# Patient Record
Sex: Male | Born: 1955 | Race: White | Hispanic: No | Marital: Married | Smoking: Former smoker
Health system: Southern US, Community
[De-identification: ages and names within clinical notes are randomized; demographics above are authoritative.]

## PROBLEM LIST (undated history)

## (undated) DIAGNOSIS — H269 Unspecified cataract: Secondary | ICD-10-CM

## (undated) DIAGNOSIS — I10 Essential (primary) hypertension: Secondary | ICD-10-CM

## (undated) HISTORY — PX: COLONOSCOPY: SHX174

## (undated) HISTORY — DX: Unspecified cataract: H26.9

## (undated) HISTORY — DX: Essential (primary) hypertension: I10

## (undated) HISTORY — PX: COLOSTOMY: SHX63

## (undated) HISTORY — PX: HERNIA REPAIR: SHX51

## (undated) HISTORY — PX: OPEN REDUCTION INTERNAL FIXATION (ORIF) TIBIA/FIBULA FRACTURE: SHX5992

---

## 1998-01-14 HISTORY — PX: ANAL FISSURE REPAIR: SHX2312

## 1998-11-30 ENCOUNTER — Ambulatory Visit: Admission: RE | Admit: 1998-11-30 | Discharge: 1998-11-30 | Payer: Self-pay | Admitting: Otolaryngology

## 1999-01-12 ENCOUNTER — Encounter (INDEPENDENT_AMBULATORY_CARE_PROVIDER_SITE_OTHER): Payer: Self-pay | Admitting: Specialist

## 1999-01-12 ENCOUNTER — Other Ambulatory Visit: Admission: RE | Admit: 1999-01-12 | Discharge: 1999-01-12 | Payer: Self-pay | Admitting: Otolaryngology

## 2003-05-26 ENCOUNTER — Encounter: Admission: RE | Admit: 2003-05-26 | Discharge: 2003-05-26 | Payer: Self-pay | Admitting: Family Medicine

## 2010-01-28 ENCOUNTER — Emergency Department (HOSPITAL_COMMUNITY)
Admission: EM | Admit: 2010-01-28 | Discharge: 2010-01-28 | Payer: Self-pay | Source: Home / Self Care | Admitting: Emergency Medicine

## 2010-01-29 LAB — POCT CARDIAC MARKERS
CKMB, poc: <1 ng/mL — ABNORMAL LOW (ref 1.0–8.0)
Myoglobin, poc: 64.6 ng/mL (ref 12–200)
Troponin i, poc: 0.06 ng/mL (ref 0.00–0.09)

## 2010-01-29 LAB — CBC
HCT: 44.2 % (ref 39.0–52.0)
Hemoglobin: 15.2 g/dL (ref 13.0–17.0)
MCH: 30.1 pg (ref 26.0–34.0)
MCHC: 34.4 g/dL (ref 30.0–36.0)
MCV: 87.5 fL (ref 78.0–100.0)
Platelets: 216 10*3/uL (ref 150–400)
RBC: 5.05 MIL/uL (ref 4.22–5.81)
RDW: 12.3 % (ref 11.5–15.5)
WBC: 9 10*3/uL (ref 4.0–10.5)

## 2010-01-29 LAB — BASIC METABOLIC PANEL
BUN: 9 mg/dL (ref 6–23)
CO2: 29 mEq/L (ref 19–32)
Calcium: 9.4 mg/dL (ref 8.4–10.5)
Chloride: 103 mEq/L (ref 96–112)
Creatinine, Ser: 0.89 mg/dL (ref 0.4–1.5)
GFR calc Af Amer: >60 mL/min (ref >60)
GFR calc non Af Amer: >60 mL/min (ref >60)
Glucose, Bld: 103 mg/dL — ABNORMAL HIGH (ref 70–99)
Potassium: 4.1 mEq/L (ref 3.5–5.1)
Sodium: 143 mEq/L (ref 135–145)

## 2011-03-05 ENCOUNTER — Telehealth (INDEPENDENT_AMBULATORY_CARE_PROVIDER_SITE_OTHER): Payer: Self-pay

## 2011-03-05 NOTE — Telephone Encounter (Signed)
Spoke to patient with his appointment date & time (03/21/11 w/Dr. Johna Sheriff)

## 2011-03-05 NOTE — Telephone Encounter (Signed)
The patient has had double hernia repair before by Dr Johna Sheriff.  He has an umbilical hernia that caused severe pain yesterday.  He felt flushed and took some gas-x to see if it helped.  He was not vomiting.  He has had a small bowel movement.  I instructed him on what to watch for that would require going to the ER.  He would like a soon appointment with Dr Johna Sheriff.  His pain today is only a level 1.  Please call if can work him in.

## 2011-03-15 ENCOUNTER — Telehealth (INDEPENDENT_AMBULATORY_CARE_PROVIDER_SITE_OTHER): Payer: Self-pay

## 2011-03-15 NOTE — Telephone Encounter (Signed)
Left message to call our office to schedule follow up appointment w/Dr. Johna Sheriff

## 2011-03-21 ENCOUNTER — Ambulatory Visit (INDEPENDENT_AMBULATORY_CARE_PROVIDER_SITE_OTHER): Payer: BC Managed Care – PPO | Admitting: General Surgery

## 2011-03-21 ENCOUNTER — Telehealth (INDEPENDENT_AMBULATORY_CARE_PROVIDER_SITE_OTHER): Payer: Self-pay | Admitting: General Surgery

## 2011-03-21 ENCOUNTER — Encounter (INDEPENDENT_AMBULATORY_CARE_PROVIDER_SITE_OTHER): Payer: Self-pay | Admitting: General Surgery

## 2011-03-21 VITALS — BP 132/80 | HR 76 | Temp 97.6°F | Resp 16 | Ht 69.0 in | Wt 229.6 lb

## 2011-03-21 DIAGNOSIS — K429 Umbilical hernia without obstruction or gangrene: Secondary | ICD-10-CM

## 2011-03-21 NOTE — Progress Notes (Signed)
Subjective:   Umbilical hernia  Patient ID: Bryan Hampton, male   DOB: 09/09/1955, 56 y.o.   MRN: [id number]  HPI Patient returns to the office known to me from a previous bilateral open inguinal hernia repair in 2005. He has done well from that but within a couple of years afterwards he noted a small bulge at his umbilicus. This has been entirely asymptomatic until about 3 weeks ago. At that time he suddenly developed some diffuse abdominal pain as well as diaphoresis and weakness with a sharp pain and tenderness at his umbilicus. This resolved after rest after about 45 minutes. It has not recurred. He is here to discuss repair.  Past Medical History  Diagnosis Date  . Hypertension    Past Surgical History  Procedure Date  . Hernia repair 2006, 2007  . Anal fissure repair 2000   Current Outpatient Prescriptions  Medication Sig Dispense Refill  . MICARDIS 80 MG tablet        Allergies  Allergen Reactions  . Penicillins Hives and Itching   History  Substance Use Topics  . Smoking status: Former Smoker    Types: Cigarettes    Quit date: 03/20/1988  . Smokeless tobacco: Not on file  . Alcohol Use: Yes    Review of Systems  Constitutional: Negative.   Respiratory: Negative.   Cardiovascular: Negative.   Gastrointestinal: Positive for abdominal pain. Negative for nausea, vomiting, diarrhea, constipation, abdominal distention and anal bleeding.       Objective:   Physical Exam BP 132/80  Pulse 76  Temp(Src) 97.6 F (36.4 C) (Temporal)  Resp 16  Ht 5\' 9"  (1.753 m)  Wt 229 lb 9.6 oz (104.146 kg)  BMI 33.91 kg/m2 General: Alert, well-developed Caucasian, in no distress Skin: Warm and dry without rash or infection. HEENT: No palpable masses or thyromegaly. Sclera nonicteric. Pupils equal round and reactive. Oropharynx clear. Lymph nodes: No cervical, supraclavicular, or inguinal nodes palpable. Lungs: Breath sounds clear and equal without increased work of  breathing Cardiovascular: Regular rate and rhythm without murmur. No JVD or edema. Peripheral pulses intact. Abdomen: Nondistended. Soft and nontender. No masses palpable. No organomegaly. Moderately obese. There is a moderate diastases. There is a discrete small reducible approximately 1-2 cm hernia at the umbilicus. Extremities: No edema or joint swelling or deformity. No chronic venous stasis changes. Neurologic: Alert and fully oriented. Gait normal.     Assessment:     Symptomatic umbilical hernia. He has had a brief episode of incarceration by history and this needs to be repaired. I recommended repair with mesh under general anesthesia an outpatient. Risks of bleeding, infection, recurrence, and anesthetic risks were discussed and understood and he desires to proceed.    Plan:     Repair of umbilical hernia with mesh as an outpatient.

## 2011-03-27 DIAGNOSIS — K429 Umbilical hernia without obstruction or gangrene: Secondary | ICD-10-CM

## 2011-04-03 ENCOUNTER — Telehealth (INDEPENDENT_AMBULATORY_CARE_PROVIDER_SITE_OTHER): Payer: Self-pay

## 2011-04-03 NOTE — Telephone Encounter (Signed)
Po appt given to patient for 04/19/11 @ 9:10 w/Dr. Johna Sheriff

## 2011-04-19 ENCOUNTER — Ambulatory Visit (INDEPENDENT_AMBULATORY_CARE_PROVIDER_SITE_OTHER): Payer: BC Managed Care – PPO | Admitting: General Surgery

## 2011-04-19 ENCOUNTER — Encounter (INDEPENDENT_AMBULATORY_CARE_PROVIDER_SITE_OTHER): Payer: Self-pay | Admitting: General Surgery

## 2011-04-19 VITALS — BP 110/72 | HR 78 | Temp 97.0°F | Ht 69.0 in | Wt 228.2 lb

## 2011-04-19 DIAGNOSIS — Z09 Encounter for follow-up examination after completed treatment for conditions other than malignant neoplasm: Secondary | ICD-10-CM

## 2011-04-19 NOTE — Progress Notes (Signed)
The patient returns following umbilical hernia repair. He reports minimal postoperative discomfort that has resolved. No complaints today.  On exam the wound is well-healed and repair feels solid.  He is doing well following umbilical hernia repair and is discharged return as needed.

## 2013-03-25 LAB — HM COLONOSCOPY

## 2013-08-23 ENCOUNTER — Emergency Department (HOSPITAL_COMMUNITY)
Admission: EM | Admit: 2013-08-23 | Discharge: 2013-08-23 | Disposition: A | Discharge status: Home / Self Care | Payer: BC Managed Care – PPO | Attending: Emergency Medicine | Admitting: Emergency Medicine

## 2013-08-23 ENCOUNTER — Emergency Department (HOSPITAL_COMMUNITY): Payer: BC Managed Care – PPO

## 2013-08-23 ENCOUNTER — Encounter (HOSPITAL_COMMUNITY): Payer: Self-pay | Admitting: Emergency Medicine

## 2013-08-23 DIAGNOSIS — R0789 Other chest pain: Secondary | ICD-10-CM | POA: Insufficient documentation

## 2013-08-23 DIAGNOSIS — R079 Chest pain, unspecified: Secondary | ICD-10-CM | POA: Insufficient documentation

## 2013-08-23 DIAGNOSIS — Z880 Allergy status to penicillin: Secondary | ICD-10-CM | POA: Diagnosis not present

## 2013-08-23 DIAGNOSIS — Z79899 Other long term (current) drug therapy: Secondary | ICD-10-CM | POA: Diagnosis not present

## 2013-08-23 DIAGNOSIS — R209 Unspecified disturbances of skin sensation: Secondary | ICD-10-CM | POA: Diagnosis not present

## 2013-08-23 DIAGNOSIS — I10 Essential (primary) hypertension: Secondary | ICD-10-CM | POA: Insufficient documentation

## 2013-08-23 DIAGNOSIS — Z87891 Personal history of nicotine dependence: Secondary | ICD-10-CM | POA: Insufficient documentation

## 2013-08-23 DIAGNOSIS — Z792 Long term (current) use of antibiotics: Secondary | ICD-10-CM | POA: Insufficient documentation

## 2013-08-23 LAB — CBC
HCT: 47.3 % (ref 39.0–52.0)
Hemoglobin: 16.2 g/dL (ref 13.0–17.0)
MCH: 31 pg (ref 26.0–34.0)
MCHC: 34.2 g/dL (ref 30.0–36.0)
MCV: 90.4 fL (ref 78.0–100.0)
Platelets: 204 10*3/uL (ref 150–400)
RBC: 5.23 MIL/uL (ref 4.22–5.81)
RDW: 12.8 % (ref 11.5–15.5)
WBC: 7.9 10*3/uL (ref 4.0–10.5)

## 2013-08-23 LAB — BASIC METABOLIC PANEL
Anion gap: 12 (ref 5–15)
BUN: 13 mg/dL (ref 6–23)
CO2: 28 mEq/L (ref 19–32)
Calcium: 9.4 mg/dL (ref 8.4–10.5)
Chloride: 104 mEq/L (ref 96–112)
Creatinine, Ser: 0.8 mg/dL (ref 0.50–1.35)
GFR calc Af Amer: >90 mL/min (ref >90)
GFR calc non Af Amer: >90 mL/min (ref >90)
Glucose, Bld: 96 mg/dL (ref 70–99)
Potassium: 4.4 mEq/L (ref 3.7–5.3)
Sodium: 144 mEq/L (ref 137–147)

## 2013-08-23 LAB — I-STAT TROPONIN, ED
Troponin i, poc: 0.02 ng/mL (ref 0.00–0.08)
Troponin i, poc: 0 ng/mL (ref 0.00–0.08)

## 2013-08-23 NOTE — ED Notes (Signed)
Pt c/o left sided chest pressure and some numbness to left arm; pt sts some SOB that has now resolved

## 2013-08-23 NOTE — ED Notes (Signed)
Dr. Linker at bedside  

## 2013-08-23 NOTE — ED Provider Notes (Signed)
CSN: [id number]     Arrival date & time 08/23/13  1335 History   First MD Initiated Contact with Patient 08/23/13 1503     Chief Complaint  Patient presents with  . Chest Pain     (Consider location/radiation/quality/duration/timing/severity/associated sxs/prior Treatment) HPI Pt presents with c/o left hand and arm numbness associated with sensation of chest and abdominal pressure.  He states he was driving to a meeting and began to have some anxiety symptoms which he has had in the past.  With the anxiety he felt short of breath.  Symptoms were constant for approx 2 hours, now have resolved. No nausea, no diaphoresis.  No leg swelling. No recent travel/trauma/surgery.    Past Medical History  Diagnosis Date  . Hypertension    Past Surgical History  Procedure Laterality Date  . Hernia repair  2006, 2007  . Anal fissure repair  2000   History reviewed. No pertinent family history. History  Substance Use Topics  . Smoking status: Former Smoker    Types: Cigarettes    Quit date: 03/20/1988  . Smokeless tobacco: Not on file  . Alcohol Use: Yes    Review of Systems ROS reviewed and all otherwise negative except for mentioned in HPI    Allergies  Penicillins  Home Medications   Prior to Admission medications   Medication Sig Start Date End Date Taking? Authorizing Provider  doxycycline (VIBRA-TABS) 100 MG tablet Take 100 mg by mouth 2 (two) times daily.   Yes Historical Provider, MD  ibuprofen (ADVIL,MOTRIN) 200 MG tablet Take 400 mg by mouth every 6 (six) hours as needed (pain).   Yes Historical Provider, MD  losartan (COZAAR) 100 MG tablet Take 100 mg by mouth daily.   Yes Historical Provider, MD   BP 115/73  Pulse 74  Temp(Src) 97.9 F (36.6 C) (Oral)  Resp 18  SpO2 96% Vitals reviewed Physical Exam Physical Examination: General appearance - alert, well appearing, and in no distress Mental status - alert, oriented to person, place, and time Eyes - no  conjunctival injection, no scleral icterus Mouth - mucous membranes moist, pharynx normal without lesions Chest - clear to auscultation, no wheezes, rales or rhonchi, symmetric air entry Heart - normal rate, regular rhythm, normal S1, S2, no murmurs, rubs, clicks or gallops Abdomen - soft, nontender, nondistended, no masses or organomegaly Extremities - peripheral pulses normal, no pedal edema, no clubbing or cyanosis Skin - normal coloration and turgor, no rashes Neuro- awake, alert and oriented x 3, cranial nerves 2-12 tested and intact, strength 5/5 in extremities x 4, sensation intact  ED Course  Procedures (including critical care time) Labs Review Labs Reviewed  CBC  BASIC METABOLIC PANEL  I-STAT TROPOININ, ED  Rosezena SensorI-STAT TROPOININ, ED    Imaging Review Dg Chest 2 View  08/23/2013   CLINICAL DATA:  Chest pain with left hand numbness.  EXAM: CHEST  2 VIEW  COMPARISON:  01/28/2010  FINDINGS: Lungs are clear. Cardiomediastinal silhouette and remainder of the exam is unchanged.  IMPRESSION: No active cardiopulmonary disease.   Electronically Signed   By: Elberta Fortisaniel  Boyle M.D.   On: 08/23/2013 14:28   Ct Head Wo Contrast  08/23/2013   CLINICAL DATA:  Numbness in left hand  EXAM: CT HEAD WITHOUT CONTRAST  TECHNIQUE: Contiguous axial images were obtained from the base of the skull through the vertex without intravenous contrast.  COMPARISON:  None.  FINDINGS: No acute intracranial hemorrhage. No focal mass lesion. No CT evidence of acute  infarction. No midline shift or mass effect. No hydrocephalus. Basilar cisterns are patent.  Paranasal sinuses and  mastoid air cells are clear.  IMPRESSION: Normal head CT.   Electronically Signed   By: Genevive Bi M.D.   On: 08/23/2013 16:03     EKG Interpretation   Date/Time:  Monday August 23 2013 13:38:19 EDT Ventricular Rate:  82 PR Interval:  158 QRS Duration: 90 QT Interval:  370 QTC Calculation: 432 R Axis:   61 Text Interpretation:  Normal  sinus rhythm Normal ECG No significant change  since last tracing Confirmed by [facility]  MD, Kyrie Bun [phone]) on 08/23/2013  3:07:50 PM      MDM   Final diagnoses:  Atypical chest pain    Pt is TIMI 0, Heart score 2, PERC 1 due to age, Megan Mans' criteria low risk.  All these factors make ACS and PE unlikely in this patient.  He has also had 2 positive troponins.  Pt advised followup with PMD.  Discharged with strict return precautions.  Pt agreeable with plan.   Ethelda Chick, MD 08/24/13 [phone]

## 2013-08-23 NOTE — ED Notes (Addendum)
Pt c/o left sided chest pressure and tightness radiating to L arm starting at 1045 am. Reports driving when episode started, felt "some anxiety." Numbness reported to L arm and hand that still persists on assessment; denies shortness of breath, nausea, diaphoresis, dizziness, or headache. Pt reports frequent traveling with job; [place] to [place]

## 2013-08-23 NOTE — Discharge Instructions (Signed)
Return to the ED with any concerns including difficulty breathing, recurring chest pain, fainting, weakness of arms or legs, changes in vision/speech, decreased level of alertness/lethargy, or any other alarming symptoms

## 2015-07-24 DIAGNOSIS — M19011 Primary osteoarthritis, right shoulder: Secondary | ICD-10-CM | POA: Diagnosis not present

## 2015-09-01 DIAGNOSIS — I10 Essential (primary) hypertension: Secondary | ICD-10-CM | POA: Diagnosis not present

## 2015-09-01 DIAGNOSIS — R8290 Unspecified abnormal findings in urine: Secondary | ICD-10-CM | POA: Diagnosis not present

## 2015-09-01 DIAGNOSIS — Z0000 Encounter for general adult medical examination without abnormal findings: Secondary | ICD-10-CM | POA: Diagnosis not present

## 2015-09-01 DIAGNOSIS — Z23 Encounter for immunization: Secondary | ICD-10-CM | POA: Diagnosis not present

## 2016-09-11 DIAGNOSIS — Z0000 Encounter for general adult medical examination without abnormal findings: Secondary | ICD-10-CM | POA: Diagnosis not present

## 2016-09-11 DIAGNOSIS — Z13220 Encounter for screening for lipoid disorders: Secondary | ICD-10-CM | POA: Diagnosis not present

## 2016-09-13 DIAGNOSIS — R8290 Unspecified abnormal findings in urine: Secondary | ICD-10-CM | POA: Diagnosis not present

## 2016-09-13 DIAGNOSIS — H6122 Impacted cerumen, left ear: Secondary | ICD-10-CM | POA: Diagnosis not present

## 2016-09-13 DIAGNOSIS — Z0000 Encounter for general adult medical examination without abnormal findings: Secondary | ICD-10-CM | POA: Diagnosis not present

## 2016-11-04 DIAGNOSIS — G4733 Obstructive sleep apnea (adult) (pediatric): Secondary | ICD-10-CM | POA: Diagnosis not present

## 2016-11-25 DIAGNOSIS — G4733 Obstructive sleep apnea (adult) (pediatric): Secondary | ICD-10-CM | POA: Diagnosis not present

## 2016-12-01 DIAGNOSIS — G4733 Obstructive sleep apnea (adult) (pediatric): Secondary | ICD-10-CM | POA: Diagnosis not present

## 2017-06-06 DIAGNOSIS — M19011 Primary osteoarthritis, right shoulder: Secondary | ICD-10-CM | POA: Diagnosis not present

## 2017-09-19 DIAGNOSIS — Z13220 Encounter for screening for lipoid disorders: Secondary | ICD-10-CM | POA: Diagnosis not present

## 2017-09-19 DIAGNOSIS — Z0000 Encounter for general adult medical examination without abnormal findings: Secondary | ICD-10-CM | POA: Diagnosis not present

## 2017-09-24 DIAGNOSIS — Z0000 Encounter for general adult medical examination without abnormal findings: Secondary | ICD-10-CM | POA: Diagnosis not present

## 2017-09-24 DIAGNOSIS — R7301 Impaired fasting glucose: Secondary | ICD-10-CM | POA: Diagnosis not present

## 2017-09-24 DIAGNOSIS — I10 Essential (primary) hypertension: Secondary | ICD-10-CM | POA: Diagnosis not present

## 2017-09-24 DIAGNOSIS — N529 Male erectile dysfunction, unspecified: Secondary | ICD-10-CM | POA: Diagnosis not present

## 2017-09-24 DIAGNOSIS — Z23 Encounter for immunization: Secondary | ICD-10-CM | POA: Diagnosis not present

## 2018-10-09 DIAGNOSIS — Z23 Encounter for immunization: Secondary | ICD-10-CM | POA: Diagnosis not present

## 2018-10-09 DIAGNOSIS — R8290 Unspecified abnormal findings in urine: Secondary | ICD-10-CM | POA: Diagnosis not present

## 2018-10-09 DIAGNOSIS — Z0000 Encounter for general adult medical examination without abnormal findings: Secondary | ICD-10-CM | POA: Diagnosis not present

## 2018-10-22 DIAGNOSIS — R7301 Impaired fasting glucose: Secondary | ICD-10-CM | POA: Diagnosis not present

## 2018-10-22 DIAGNOSIS — I10 Essential (primary) hypertension: Secondary | ICD-10-CM | POA: Diagnosis not present

## 2018-10-22 DIAGNOSIS — Z6835 Body mass index (BMI) 35.0-35.9, adult: Secondary | ICD-10-CM | POA: Diagnosis not present

## 2018-10-22 DIAGNOSIS — Z125 Encounter for screening for malignant neoplasm of prostate: Secondary | ICD-10-CM | POA: Diagnosis not present

## 2018-11-30 DIAGNOSIS — R972 Elevated prostate specific antigen [PSA]: Secondary | ICD-10-CM | POA: Diagnosis not present

## 2019-02-23 DIAGNOSIS — L249 Irritant contact dermatitis, unspecified cause: Secondary | ICD-10-CM | POA: Diagnosis not present

## 2019-02-23 DIAGNOSIS — D225 Melanocytic nevi of trunk: Secondary | ICD-10-CM | POA: Diagnosis not present

## 2019-03-03 DIAGNOSIS — M25511 Pain in right shoulder: Secondary | ICD-10-CM | POA: Diagnosis not present

## 2019-03-03 DIAGNOSIS — M19011 Primary osteoarthritis, right shoulder: Secondary | ICD-10-CM | POA: Diagnosis not present

## 2019-10-22 DIAGNOSIS — H40033 Anatomical narrow angle, bilateral: Secondary | ICD-10-CM | POA: Diagnosis not present

## 2019-10-22 DIAGNOSIS — H2513 Age-related nuclear cataract, bilateral: Secondary | ICD-10-CM | POA: Diagnosis not present

## 2019-10-22 DIAGNOSIS — H40013 Open angle with borderline findings, low risk, bilateral: Secondary | ICD-10-CM | POA: Diagnosis not present

## 2019-10-29 DIAGNOSIS — H2512 Age-related nuclear cataract, left eye: Secondary | ICD-10-CM | POA: Diagnosis not present

## 2019-10-29 DIAGNOSIS — H40013 Open angle with borderline findings, low risk, bilateral: Secondary | ICD-10-CM | POA: Diagnosis not present

## 2019-10-29 DIAGNOSIS — H2513 Age-related nuclear cataract, bilateral: Secondary | ICD-10-CM | POA: Diagnosis not present

## 2019-10-29 DIAGNOSIS — I10 Essential (primary) hypertension: Secondary | ICD-10-CM | POA: Diagnosis not present

## 2019-10-29 DIAGNOSIS — H40033 Anatomical narrow angle, bilateral: Secondary | ICD-10-CM | POA: Diagnosis not present

## 2019-11-11 DIAGNOSIS — Z23 Encounter for immunization: Secondary | ICD-10-CM | POA: Diagnosis not present

## 2019-11-11 DIAGNOSIS — Z13220 Encounter for screening for lipoid disorders: Secondary | ICD-10-CM | POA: Diagnosis not present

## 2019-11-11 DIAGNOSIS — Z0000 Encounter for general adult medical examination without abnormal findings: Secondary | ICD-10-CM | POA: Diagnosis not present

## 2019-11-11 DIAGNOSIS — R7303 Prediabetes: Secondary | ICD-10-CM | POA: Diagnosis not present

## 2019-11-15 ENCOUNTER — Other Ambulatory Visit: Payer: Self-pay | Admitting: Nurse Practitioner

## 2019-11-15 DIAGNOSIS — M7989 Other specified soft tissue disorders: Secondary | ICD-10-CM

## 2019-11-24 DIAGNOSIS — H2512 Age-related nuclear cataract, left eye: Secondary | ICD-10-CM | POA: Diagnosis not present

## 2019-11-25 DIAGNOSIS — H2511 Age-related nuclear cataract, right eye: Secondary | ICD-10-CM | POA: Diagnosis not present

## 2019-11-25 DIAGNOSIS — H2512 Age-related nuclear cataract, left eye: Secondary | ICD-10-CM | POA: Diagnosis not present

## 2019-12-07 ENCOUNTER — Ambulatory Visit
Admission: RE | Admit: 2019-12-07 | Discharge: 2019-12-07 | Disposition: A | Discharge status: Home / Self Care | Payer: BC Managed Care – PPO | Source: Ambulatory Visit | Attending: Nurse Practitioner | Admitting: Nurse Practitioner

## 2019-12-07 ENCOUNTER — Other Ambulatory Visit: Payer: Self-pay

## 2019-12-07 DIAGNOSIS — M4804 Spinal stenosis, thoracic region: Secondary | ICD-10-CM | POA: Diagnosis not present

## 2019-12-07 DIAGNOSIS — M7989 Other specified soft tissue disorders: Secondary | ICD-10-CM

## 2019-12-07 MED ORDER — GADOBENATE DIMEGLUMINE 529 MG/ML IV SOLN
20.0000 mL | Freq: Once | INTRAVENOUS | Status: AC | PRN
  Administered 2019-12-07: 20 mL via INTRAVENOUS

## 2019-12-16 DIAGNOSIS — H2511 Age-related nuclear cataract, right eye: Secondary | ICD-10-CM | POA: Diagnosis not present

## 2020-01-20 DIAGNOSIS — D171 Benign lipomatous neoplasm of skin and subcutaneous tissue of trunk: Secondary | ICD-10-CM | POA: Diagnosis not present

## 2020-11-21 DIAGNOSIS — Z0000 Encounter for general adult medical examination without abnormal findings: Secondary | ICD-10-CM | POA: Diagnosis not present

## 2020-11-21 DIAGNOSIS — Z125 Encounter for screening for malignant neoplasm of prostate: Secondary | ICD-10-CM | POA: Diagnosis not present

## 2020-11-21 DIAGNOSIS — I10 Essential (primary) hypertension: Secondary | ICD-10-CM | POA: Diagnosis not present

## 2020-11-21 DIAGNOSIS — R7303 Prediabetes: Secondary | ICD-10-CM | POA: Diagnosis not present

## 2021-10-11 ENCOUNTER — Encounter: Payer: Self-pay | Admitting: Adult Health

## 2021-10-11 ENCOUNTER — Ambulatory Visit (INDEPENDENT_AMBULATORY_CARE_PROVIDER_SITE_OTHER): Payer: Medicare Other | Admitting: Adult Health

## 2021-10-11 VITALS — BP 132/84 | HR 69 | Temp 98.1°F | Ht 67.5 in | Wt 223.0 lb

## 2021-10-11 DIAGNOSIS — I10 Essential (primary) hypertension: Secondary | ICD-10-CM | POA: Diagnosis not present

## 2021-10-11 DIAGNOSIS — Z7689 Persons encountering health services in other specified circumstances: Secondary | ICD-10-CM | POA: Diagnosis not present

## 2021-10-11 NOTE — Progress Notes (Signed)
Patient presents to clinic today to establish care. He is a pleasant 66 year old male who  has a past medical history of Hypertension.  He is a former patient of Dr. Clarene Duke at [facility].   His last CPE was in November of last year.   Acute Concerns: Establish Care   Chronic Issues: HTN - managed with Cozaar 100 mg daily. He denies dizziness, lightheadedness, chest pain, or shortness of breath. He does not check his blood pressure at home.  BP Readings from Last 3 Encounters:  10/11/21 132/84  08/23/13 115/73  04/19/11 110/72   Health Maintenance: Dental -- Routine Care Vision -- Routine Care  Immunizations --  Refuses flu shot today.  Colonoscopy -- Is Done at [facility] - never had any polyps. 10 year plan. Believes his last one was in 2018  Diet: Has been trying to eat healthier.  Exercise: No formal exercise    Past Medical History:  Diagnosis Date   Hypertension     Past Surgical History:  Procedure Laterality Date   ANAL FISSURE REPAIR  2000   HERNIA REPAIR  2006, 2007    Current Outpatient Medications on File Prior to Visit  Medication Sig Dispense Refill   amoxicillin (AMOXIL) 500 MG capsule Take by mouth.     ibuprofen (ADVIL,MOTRIN) 200 MG tablet Take 400 mg by mouth every 6 (six) hours as needed (pain).     losartan (COZAAR) 100 MG tablet Take 100 mg by mouth daily.     No current facility-administered medications on file prior to visit.    Allergies  Allergen Reactions   Penicillins Hives and Itching    Family History  Problem Relation Age of Onset   Alzheimer's disease Maternal Grandmother    Heart disease Maternal Grandfather    Heart disease Paternal Grandfather     Social History   Socioeconomic History   Marital status: Married    Spouse name: Not on file   Number of children: Not on file   Years of education: Not on file   Highest education level: Not on file  Occupational History   Not on file  Tobacco Use   Smoking  status: Former    Types: Cigarettes    Quit date: 03/20/1988    Years since quitting: 33.5   Smokeless tobacco: Not on file  Vaping Use   Vaping Use: Never used  Substance and Sexual Activity   Alcohol use: Yes    Alcohol/week: 13.0 standard drinks of alcohol    Types: 12 Cans of beer, 1 Shots of liquor per week    Comment: liqouor once a month   Drug use: No   Sexual activity: Not on file  Other Topics Concern   Not on file  Social History Narrative   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   HENT: Negative.    Eyes: Negative.   Respiratory: Negative.    Cardiovascular: Negative.   Gastrointestinal: Negative.   Genitourinary: Negative.   Musculoskeletal: Negative.   Skin: Negative.   Neurological: Negative.   Endo/Heme/Allergies: Negative.   Psychiatric/Behavioral: Negative.      BP 132/84   Pulse 69  Temp 98.1 F (36.7 C) (Oral)   Ht 5' 7.5" (1.715 m)   Wt 223 lb (101.2 kg)   SpO2 97%   BMI 34.41 kg/m   Physical Exam Vitals and nursing note reviewed.  Constitutional:      General: He is not in acute distress.    Appearance: Normal appearance. He is well-developed and normal weight.  HENT:     Head: Normocephalic and atraumatic.     Right Ear: Tympanic membrane, ear canal and external ear normal. There is no impacted cerumen.     Left Ear: Tympanic membrane, ear canal and external ear normal. There is no impacted cerumen.     Nose: Nose normal. No congestion or rhinorrhea.     Mouth/Throat:     Mouth: Mucous membranes are moist.     Pharynx: Oropharynx is clear. No oropharyngeal exudate or posterior oropharyngeal erythema.  Eyes:     General:        Right eye: No  discharge.        Left eye: No discharge.     Extraocular Movements: Extraocular movements intact.     Conjunctiva/sclera: Conjunctivae normal.     Pupils: Pupils are equal, round, and reactive to light.  Neck:     Vascular: No carotid bruit.     Trachea: No tracheal deviation.  Cardiovascular:     Rate and Rhythm: Normal rate and regular rhythm.     Pulses: Normal pulses.     Heart sounds: Normal heart sounds. No murmur heard.    No friction rub. No gallop.  Pulmonary:     Effort: Pulmonary effort is normal. No respiratory distress.     Breath sounds: Normal breath sounds. No stridor. No wheezing, rhonchi or rales.  Chest:     Chest wall: No tenderness.  Abdominal:     General: Bowel sounds are normal. There is no distension.     Palpations: Abdomen is soft. There is no mass.     Tenderness: There is no abdominal tenderness. There is no right CVA tenderness, left CVA tenderness, guarding or rebound.     Hernia: No hernia is present.  Musculoskeletal:        General: No swelling, tenderness, deformity or signs of injury. Normal range of motion.     Right lower leg: No edema.     Left lower leg: No edema.  Lymphadenopathy:     Cervical: No cervical adenopathy.  Skin:    General: Skin is warm and dry.     Capillary Refill: Capillary refill takes less than 2 seconds.     Coloration: Skin is not jaundiced or pale.     Findings: No bruising, erythema, lesion or rash.  Neurological:     General: No focal deficit present.     Mental Status: He is alert and oriented to person, place, and time.     Cranial Nerves: No cranial nerve deficit.     Sensory: No sensory deficit.     Motor: No weakness.     Coordination: Coordination normal.     Gait: Gait normal.     Deep Tendon Reflexes: Reflexes normal.  Psychiatric:        Mood and Affect: Mood normal.        Behavior: Behavior normal.        Thought Content: Thought content normal.        Judgment: Judgment normal.     Assessment/Plan:  1. Encounter to establish care - encouraged weight loss  through diet and exercise - Follow up in November/Dec for CPE  - Will request records from [place]   2. Primary hypertension - Continue with Losartan   Dorothyann Peng, NP

## 2021-10-11 NOTE — Patient Instructions (Addendum)
It was great meeting you today   Please follow up in November/December for your physical exam   Please sign a release of information at the front desk   If you need anything in the meantime, please let me know

## 2021-11-27 ENCOUNTER — Encounter: Payer: Self-pay | Admitting: Adult Health

## 2021-11-27 ENCOUNTER — Ambulatory Visit (INDEPENDENT_AMBULATORY_CARE_PROVIDER_SITE_OTHER): Payer: Medicare Other | Admitting: Adult Health

## 2021-11-27 VITALS — BP 130/80 | HR 67 | Temp 97.8°F | Ht 67.75 in | Wt 221.0 lb

## 2021-11-27 DIAGNOSIS — Z125 Encounter for screening for malignant neoplasm of prostate: Secondary | ICD-10-CM | POA: Diagnosis not present

## 2021-11-27 DIAGNOSIS — I10 Essential (primary) hypertension: Secondary | ICD-10-CM

## 2021-11-27 DIAGNOSIS — Z136 Encounter for screening for cardiovascular disorders: Secondary | ICD-10-CM

## 2021-11-27 DIAGNOSIS — Z1159 Encounter for screening for other viral diseases: Secondary | ICD-10-CM

## 2021-11-27 DIAGNOSIS — Z0000 Encounter for general adult medical examination without abnormal findings: Secondary | ICD-10-CM

## 2021-11-27 LAB — LIPID PANEL
Cholesterol: 162 mg/dL (ref 0–200)
HDL: 45.8 mg/dL (ref >39.00)
LDL Cholesterol: 96 mg/dL (ref 0–99)
NonHDL: 115.89
Total CHOL/HDL Ratio: 4
Triglycerides: 100 mg/dL (ref 0.0–149.0)
VLDL: 20 mg/dL (ref 0.0–40.0)

## 2021-11-27 LAB — COMPREHENSIVE METABOLIC PANEL
ALT: 15 U/L (ref 0–53)
AST: 15 U/L (ref 0–37)
Albumin: 4.3 g/dL (ref 3.5–5.2)
Alkaline Phosphatase: 70 U/L (ref 39–117)
BUN: 15 mg/dL (ref 6–23)
CO2: 32 mEq/L (ref 19–32)
Calcium: 9.5 mg/dL (ref 8.4–10.5)
Chloride: 103 mEq/L (ref 96–112)
Creatinine, Ser: 0.86 mg/dL (ref 0.40–1.50)
GFR: 90.21 mL/min (ref >60.00)
Glucose, Bld: 108 mg/dL — ABNORMAL HIGH (ref 70–99)
Potassium: 5.1 mEq/L (ref 3.5–5.1)
Sodium: 139 mEq/L (ref 135–145)
Total Bilirubin: 0.8 mg/dL (ref 0.2–1.2)
Total Protein: 6.7 g/dL (ref 6.0–8.3)

## 2021-11-27 LAB — CBC WITH DIFFERENTIAL/PLATELET
Basophils Absolute: 0 10*3/uL (ref 0.0–0.1)
Basophils Relative: 0.7 % (ref 0.0–3.0)
Eosinophils Absolute: 0.1 10*3/uL (ref 0.0–0.7)
Eosinophils Relative: 2 % (ref 0.0–5.0)
HCT: 47.1 % (ref 39.0–52.0)
Hemoglobin: 15.9 g/dL (ref 13.0–17.0)
Lymphocytes Relative: 28.5 % (ref 12.0–46.0)
Lymphs Abs: 1.5 10*3/uL (ref 0.7–4.0)
MCHC: 33.9 g/dL (ref 30.0–36.0)
MCV: 92.2 fl (ref 78.0–100.0)
Monocytes Absolute: 0.4 10*3/uL (ref 0.1–1.0)
Monocytes Relative: 8.6 % (ref 3.0–12.0)
Neutro Abs: 3.1 10*3/uL (ref 1.4–7.7)
Neutrophils Relative %: 60.2 % (ref 43.0–77.0)
Platelets: 202 10*3/uL (ref 150.0–400.0)
RBC: 5.1 Mil/uL (ref 4.22–5.81)
RDW: 13.2 % (ref 11.5–15.5)
WBC: 5.2 10*3/uL (ref 4.0–10.5)

## 2021-11-27 LAB — HEMOGLOBIN A1C: Hgb A1c MFr Bld: 5.8 % (ref 4.6–6.5)

## 2021-11-27 LAB — TSH: TSH: 1.84 u[IU]/mL (ref 0.35–5.50)

## 2021-11-27 LAB — PSA: PSA: 3.75 ng/mL (ref 0.10–4.00)

## 2021-11-27 MED ORDER — LOSARTAN POTASSIUM 100 MG PO TABS: 100.0000 mg | ORAL_TABLET | Freq: Every day | ORAL | 3 refills | Status: DC

## 2021-11-27 NOTE — Patient Instructions (Signed)
It was great seeing you today   We will follow up with you regarding your lab work   Please let me know if you need anything   

## 2021-11-27 NOTE — Progress Notes (Signed)
Subjective:    Patient ID: Bryan Hampton, male    DOB: February 18, 1955, 66 y.o.   MRN: [id number]  HPI Patient presents for yearly preventative medicine examination. He is a pleasant 66 year old male who  has a past medical history of Hypertension.   HTN -he is managed with Cozaar 100 mg daily.  He denies dizziness, lightheadedness, chest pain, or shortness of breath.  He does not check his blood pressure at home BP Readings from Last 3 Encounters:  11/27/21 130/80  10/11/21 132/84  08/23/13 115/73   All immunizations and health maintenance protocols were reviewed with the patient and needed orders were placed. Does not want to get flu shot today.   Appropriate screening laboratory values were ordered for the patient including screening of hyperlipidemia, renal function and hepatic function. If indicated by BPH, a PSA was ordered.  Medication reconciliation,  past medical history, social history, problem list and allergies were reviewed in detail with the patient  Goals were established with regard to weight loss, exercise, and  diet in compliance with medications  He is up-to-date on his routine colon cancer screening, I do not have these records but he reports having one done in 2018 at [facility].  He is on the 10-year plan.  Review of Systems  Constitutional: Negative.   HENT: Negative.    Eyes: Negative.   Respiratory: Negative.    Cardiovascular: Negative.   Gastrointestinal: Negative.   Endocrine: Negative.   Genitourinary: Negative.   Musculoskeletal: Negative.   Skin: Negative.   Allergic/Immunologic: Negative.   Neurological: Negative.   Hematological: Negative.   Psychiatric/Behavioral: Negative.    All other systems reviewed and are negative.  Past Medical History:  Diagnosis Date   Hypertension     Social History   Socioeconomic History   Marital status: Married    Spouse name: Not on file   Number of children: Not on file   Years of education:  Not on file   Highest education level: Not on file  Occupational History   Not on file  Tobacco Use   Smoking status: Former    Types: Cigarettes    Quit date: 03/20/1988    Years since quitting: 33.7   Smokeless tobacco: Not on file  Vaping Use   Vaping Use: Never used  Substance and Sexual Activity   Alcohol use: Yes    Alcohol/week: 13.0 standard drinks of alcohol    Types: 12 Cans of beer, 1 Shots of liquor per week    Comment: liqouor once a month   Drug use: No   Sexual activity: Not on file  Other Topics Concern   Not on file  Social History Narrative   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[employer] Strain: Not on file  Food Insecurity: Not on file  Transportation Needs: Not on file  Physical Activity: Not on file  Stress: Not on file  Social Connections: Not on file  Intimate Partner Violence: Not on file    Past Surgical History:  Procedure Laterality Date   ANAL FISSURE REPAIR  2000   HERNIA REPAIR  2006, 2007    Family History  Problem Relation Age of Onset   Alzheimer's disease Maternal Grandmother    Heart disease Maternal Grandfather    Heart disease Paternal Grandfather     Allergies  Allergen Reactions   Penicillins Hives and Itching    Current Outpatient Medications on File Prior to Visit  Medication Sig Dispense Refill   ibuprofen (ADVIL,MOTRIN) 200 MG tablet Take 400 mg by mouth as needed (pain).     losartan (COZAAR) 100 MG tablet Take 100 mg by mouth daily.     No current facility-administered medications on file prior to visit.    BP 130/80   Pulse 67   Temp 97.8 F (36.6 C) (Oral)   Ht 5' 7.75" (1.721 m)   Wt 221 lb (100.2 kg)   SpO2 99%   BMI 33.85 kg/m       Objective:   Physical Exam Vitals and nursing note reviewed.  Constitutional:      General: He is not in acute distress.    Appearance: Normal appearance. He is well-developed and normal weight.  HENT:     Head: Normocephalic and atraumatic.      Right Ear: Tympanic membrane, ear canal and external ear normal. There is no impacted cerumen.     Left Ear: Tympanic membrane, ear canal and external ear normal. There is no impacted cerumen.     Nose: Nose normal. No congestion or rhinorrhea.     Mouth/Throat:     Mouth: Mucous membranes are moist.     Pharynx: Oropharynx is clear. No oropharyngeal exudate or posterior oropharyngeal erythema.  Eyes:     General:        Right eye: No discharge.        Left eye: No discharge.     Extraocular Movements: Extraocular movements intact.     Conjunctiva/sclera: Conjunctivae normal.     Pupils: Pupils are equal, round, and reactive to light.  Neck:     Vascular: No carotid bruit.     Trachea: No tracheal deviation.  Cardiovascular:     Rate and Rhythm: Normal rate and regular rhythm.     Pulses: Normal pulses.     Heart sounds: Normal heart sounds. No murmur heard.    No friction rub. No gallop.  Pulmonary:     Effort: Pulmonary effort is normal. No respiratory distress.     Breath sounds: Normal breath sounds. No stridor. No wheezing, rhonchi or rales.  Chest:     Chest wall: No tenderness.  Abdominal:     General: Bowel sounds are normal. There is no distension.     Palpations: Abdomen is soft. There is no mass.     Tenderness: There is no abdominal tenderness. There is no right CVA tenderness, left CVA tenderness, guarding or rebound.     Hernia: No hernia is present.  Musculoskeletal:        General: No swelling, tenderness, deformity or signs of injury. Normal range of motion.     Right lower leg: No edema.     Left lower leg: No edema.  Lymphadenopathy:     Cervical: No cervical adenopathy.  Skin:    General: Skin is warm and dry.     Capillary Refill: Capillary refill takes less than 2 seconds.     Coloration: Skin is not jaundiced or pale.     Findings: No bruising, erythema, lesion or rash.  Neurological:     General: No focal deficit present.     Mental Status: He is  alert and oriented to person, place, and time.     Cranial Nerves: No cranial nerve deficit.     Sensory: No sensory deficit.     Motor: No weakness.     Coordination: Coordination normal.     Gait: Gait normal.  Deep Tendon Reflexes: Reflexes normal.  Psychiatric:        Mood and Affect: Mood normal.        Behavior: Behavior normal.        Thought Content: Thought content normal.        Judgment: Judgment normal.       Assessment & Plan:  1. Routine general medical examination at a health care facility - Will request info from previious PCP again - Work on weight loss through diet and exercise - Follow up in one year or sooner if needed - CBC with Differential/Platelet; Future - Comprehensive metabolic panel; Future - Hemoglobin A1c; Future - Lipid panel; Future - TSH; Future  2. Primary hypertension - Continue with Cozaar 100 mg daily  - CBC with Differential/Platelet; Future - Comprehensive metabolic panel; Future - Hemoglobin A1c; Future - Lipid panel; Future - TSH; Future - losartan (COZAAR) 100 MG tablet; Take 1 tablet (100 mg total) by mouth daily.  Dispense: 90 tablet; Refill: 3  3. Need for hepatitis C screening test  - Hep C Antibody; Future  4. Prostate cancer screening  - PSA; Future  Shirline Frees, NP

## 2021-11-28 LAB — HEPATITIS C ANTIBODY: Hepatitis C Ab: NONREACTIVE

## 2022-03-06 ENCOUNTER — Telehealth: Payer: Self-pay | Admitting: Adult Health

## 2022-03-06 NOTE — Telephone Encounter (Signed)
Called patient to schedule Medicare Annual Wellness Visit (AWV). Left message for patient to call back and schedule Medicare Annual Wellness Visit (AWV).  Last date of AWV:  Due  awvi 02/14/22 per [employer]  Please schedule an appointment at any time with [facility] or [facility].  If any questions, please contact me at [phone].  Thank you ,  Barkley Boards AWV direct phone # [phone]

## 2022-05-02 ENCOUNTER — Telehealth: Payer: Self-pay | Admitting: Adult Health

## 2022-05-02 NOTE — Telephone Encounter (Signed)
Contacted Bryan Hampton to schedule their annual wellness visit. Appointment made for 05/09/22.  [name] AWV direct phone # [phone]

## 2022-05-09 ENCOUNTER — Encounter: Payer: Self-pay | Admitting: Family Medicine

## 2022-05-09 ENCOUNTER — Telehealth (INDEPENDENT_AMBULATORY_CARE_PROVIDER_SITE_OTHER): Payer: Medicare PPO | Admitting: Family Medicine

## 2022-05-09 DIAGNOSIS — Z0000 Encounter for general adult medical examination without abnormal findings: Secondary | ICD-10-CM | POA: Diagnosis not present

## 2022-05-09 NOTE — Patient Instructions (Signed)
I really enjoyed getting to talk with you today! I am available on Tuesdays and Thursdays for virtual visits if you have any questions or concerns, or if I can be of any further assistance.   CHECKLIST FROM ANNUAL WELLNESS VISIT:  -Follow up (please call to schedule if not scheduled after visit):   -yearly for annual wellness visit with primary care office  Here is a list of your preventive care/health maintenance measures and the plan for each if any are due:  PLAN For any measures below that may be due:  -please obtain your vaccine record and send [place] a copy -please obtain you last colonoscopy report and send [place] a copy  Health Maintenance  Topic Date Due   COLONOSCOPY (Pts 45-61yrs Insurance coverage will need to be confirmed)  Never done   Zoster Vaccines- Shingrix (2 of 2) 11/19/2017   Pneumonia Vaccine 42+ Years old (1 of 1 - PCV) Never done   COVID-19 Vaccine (1) 05/25/2022 (Originally 11/11/1955)   INFLUENZA VACCINE  08/15/2022   Medicare Annual Wellness (AWV)  05/09/2023   DTaP/Tdap/Td (2 - Td or Tdap) 08/31/2025   Hepatitis C Screening  Completed   HPV VACCINES  Aged Out    -See a dentist at least yearly  -Get your eyes checked and then per your eye specialist's recommendations  -Other issues addressed today:   -I have included below further information regarding a healthy whole foods based diet, physical activity guidelines for adults, stress management and opportunities for social connections. I hope you find this information useful.   -----------------------------------------------------------------------------------------------------------------------------------------------------------------------------------------------------------------------------------------------------------  NUTRITION: -eat real food: lots of colorful vegetables (half the plate) and fruits -5-7 servings of vegetables and fruits per day (fresh or steamed is best), exp. 2 servings of  vegetables with lunch and dinner and 2 servings of fruit per day. Berries and greens such as kale and collards are great choices.  -consume on a regular basis: whole grains (make sure first ingredient on label contains the word "whole"), fresh fruits, fish, nuts, seeds, healthy oils (such as olive oil, avocado oil, grape seed oil) -may eat small amounts of dairy and lean meat on occasion, but avoid processed meats such as ham, bacon, lunch meat, etc. -drink water -try to avoid fast food and pre-packaged foods, processed meat -most experts advise limiting sodium to <  per day, should limit further is any chronic conditions such as high blood pressure, heart disease, diabetes, etc. The American Heart Association advised that <  is is ideal -try to avoid foods that contain any ingredients with names you do not recognize  -try to avoid sugar/sweets (except for the natural sugar that occurs in fresh fruit) -try to avoid sweet drinks -try to avoid white rice, white bread, pasta (unless whole grain), white or yellow potatoes  EXERCISE GUIDELINES FOR ADULTS: -if you wish to increase your physical activity, do so gradually and with the approval of your doctor -STOP and seek medical care immediately if you have any chest pain, chest discomfort or trouble breathing when starting or increasing exercise  -move and stretch your body, legs, feet and arms when sitting for long periods -Physical activity guidelines for optimal health in adults: -least 150 minutes per week of aerobic exercise (can talk, but not sing) once approved by your doctor, 20-30 minutes of sustained activity or two 10 minute episodes of sustained activity every day.  -resistance training at least 2 days per week if approved by your doctor -balance exercises 3+ days per week:  Stand somewhere where you have something sturdy to hold onto if you lose balance.    1) lift up on toes, start with 5x per day and work up to 20x   2)  stand and lift on leg straight out to the side so that foot is a few inches of the floor, start with 5x each side and work up to 20x each side   3) stand on one foot, start with 5 seconds each side and work up to 20 seconds on each side  If you need ideas or help with getting more active:  -Silver sneakers https://tools.silversneakers.com  -Walk with a Doc: http://www.duncan-[email or web address]/  -try to include resistance (weight lifting/strength building) and balance exercises twice per week: or the following link for ideas: http://castillo-powell.com/  BuyDucts.dk  STRESS MANAGEMENT: -can try meditating, or just sitting quietly with deep breathing while intentionally relaxing all parts of your body for 5 minutes daily -if you need further help with stress, anxiety or depression please follow up with your primary doctor or contact the wonderful folks at [facility]: [phone]  SOCIAL CONNECTIONS: -options in [place] if you wish to engage in more social and exercise related activities:  -Silver sneakers https://tools.silversneakers.com  -Walk with a Doc: http://www.duncan-[email or web address]/  -Check out the [facility] Active Adults 50+ section on the [place] [employer] (hiking clubs, book clubs, cards and games, chess, exercise classes, aquatic classes and much more) - see the website for details: https://www.[place].gov/departments/parks-recreation/active-adults50  -YouTube has lots of exercise videos for different ages and abilities as well  -[facility] (a variety of indoor and outdoor inperson activities for adults). [phone]. [address]  -Virtual Online Classes (a variety of topics): see seniorplanet.org or call [phone]  -consider volunteering at a school, hospice center, church, senior center or elsewhere         ADVANCED HEALTHCARE  DIRECTIVES:  Everyone should have advanced health care directives in place. This is so that you get the care you want, should you ever be in a situation where you are unable to make your own medical decisions.   From the  Advanced Directive Website: "Advance Health Care Directives are legal documents in which you give written instructions about your health care if, in the future, you cannot speak for yourself.   A health care power of attorney allows you to name a person you trust to make your health care decisions if you cannot make them yourself. A declaration of a desire for a natural death (or living will) is document, which states that you desire not to have your life prolonged by extraordinary measures if you have a terminal or incurable illness or if you are in a vegetative state. An advance instruction for mental health treatment makes a declaration of instructions, information and preferences regarding your mental health treatment. It also states that you are aware that the advance instruction authorizes a mental health treatment provider to act according to your wishes. It may also outline your consent or refusal of mental health treatment. A declaration of an anatomical gift allows anyone over the age of 12 to make a gift by will, organ donor card or other document."   Please see the following website or an elder law attorney for forms, FAQs and for completion of advanced directives: [place] Health Care Directives Advance Health Care Directives (http://guzman.com/)  Or copy and paste the following to your web browser: PoshChat.fi

## 2022-05-09 NOTE — Progress Notes (Signed)
PATIENT CHECK-IN and HEALTH RISK ASSESSMENT QUESTIONNAIRE:  -completed by phone/video for upcoming Medicare Preventive Visit  Pre-Visit Check-in: 1)Vitals (height, wt, BP, etc) - record in vitals section for visit on day of visit 2)Review and Update Medications, Allergies PMH, Surgeries, Social history in Epic 3)Hospitalizations in the last year with date/reason? No  4)Review and Update Care Team (patient's specialists) in Epic 5) Complete PHQ9 in Epic  6) Complete Fall Screening in Epic 7)Review all Health Maintenance Due and order under PCP if not done.  Medicare Wellness Patient Questionnaire:  Answer theses question about your habits: Do you drink alcohol? Yes  If yes, how many drinks do you have a day?1-2 Have you ever smoked?Yes Quit date if applicable? 34 years ago  How many packs a day do/did you smoke? 1 pack  Do you use smokeless tobacco? No Do you use an illicit drugs?No Do you exercises? Yes IF so, what type and how many days/minutes per week?Walking- occasionally, active in the yard and mowing several yards, never sitting around much, works part time Are you sexually active? Yes Number of partners?1  Cooks at home, salmon, chicken, veggies Typical breakfast: Varies  Typical lunch: Sandwiches  Typical dinner: Grilled chicken, salads,  Typical snacks: Varies  Beverages: Coffee, Beer  Answer theses question about you: Can you perform most household chores? Yes Do you find it hard to follow a conversation in a noisy room?Sometimes Do you often ask people to speak up or repeat themselves? Occassionaly  Do you feel that you have a problem with memory?No  Do you balance your checkbook and or bank acounts?Yes Do you feel safe at home? Yes Last dentist visit? 04/2022 Do you need assistance with any of the following: Please note if so No  Driving?  Feeding yourself?  Getting from bed to chair?  Getting to the toilet?  Bathing or showering?  Dressing yourself?  Managing  money?  Climbing a flight of stairs  Preparing meals?    Do you have Advanced Directives in place (Living Will, Healthcare Power or Attorney)? No - working on this, did not need information   Last eye Exam and location?  08/2021 with [facility]   Do you currently use prescribed or non-prescribed narcotic or opioid pain medications? No  Do you have a history or close family history of breast, ovarian, tubal or peritoneal cancer or a family member with BRCA (breast cancer susceptibility 1 and 2) gene mutations? No  Nurse/Assistant Credentials/time stamp: MG 4:37 PM    ----------------------------------------------------------------------------------------------------------------------------------------------------------------------------------------------------------------------    MEDICARE ANNUAL PREVENTIVE CARE VISIT WITH PROVIDER (Welcome to Medicare, initial annual wellness or annual wellness exam)  Virtual Visit via Phone Note  I connected with Bryan Hampton on 05/09/22  by phone and verified that I am speaking with the correct person using two identifiers.  Location patient: home Location provider:work or home office Persons participating in the virtual visit: patient, provider  Concerns and/or follow up today: no concerns   See HM section in Epic for other details of completed HM.    ROS: negative for report of fevers, unintentional weight loss, vision changes, vision loss, hearing loss or change, chest pain, sob, hemoptysis, melena, hematochezia, hematuria, falls, bleeding or bruising, thoughts of suicide or self harm, memory loss  Patient-completed extensive health risk assessment - reviewed and discussed with the patient: See Health Risk Assessment completed with patient prior to the visit either above or in recent phone note. This was reviewed in detailed with the patient today  and appropriate recommendations, orders and referrals were placed as needed per  Summary below and patient instructions.   Review of Medical History: -PMH, PSH, Family History and current specialty and care providers reviewed and updated and listed below   Patient Care Team: Shirline Frees, NP as PCP - General (Family Medicine)   Past Medical History:  Diagnosis Date   Hypertension     Past Surgical History:  Procedure Laterality Date   ANAL FISSURE REPAIR  2000   HERNIA REPAIR  2006, 2007    Social History   Socioeconomic History   Marital status: Married    Spouse name: Not on file   Number of children: Not on file   Years of education: Not on file   Highest education level: Not on file  Occupational History   Not on file  Tobacco Use   Smoking status: Former    Types: Cigarettes    Quit date: 03/20/1988    Years since quitting: 34.1   Smokeless tobacco: Not on file  Vaping Use   Vaping Use: Never used  Substance and Sexual Activity   Alcohol use: Yes    Alcohol/week: 13.0 standard drinks of alcohol    Types: 12 Cans of beer, 1 Shots of liquor per week    Comment: liqouor once a month   Drug use: No   Sexual activity: Not on file  Other Topics Concern   Not on file  Social History Narrative   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 Strain: Not on file  Food Insecurity: Not on file  Transportation Needs: Not on file  Physical Activity: Not on file  Stress: Not on file  Social Connections: Not on file  Intimate Partner Violence: Not on file    Family History  Problem Relation Age of Onset   Alzheimer's disease Maternal Grandmother    Heart disease Maternal Grandfather    Heart disease Paternal Grandfather     Current Outpatient Medications on File Prior to Visit  Medication Sig Dispense Refill   ibuprofen (ADVIL,MOTRIN) 200 MG tablet Take 400 mg by mouth as needed (pain).     losartan (COZAAR) 100 MG tablet Take 1 tablet (100 mg total) by mouth daily. 90 tablet 3   No current facility-administered  medications on file prior to visit.    Allergies  Allergen Reactions   Penicillins Hives and Itching       Physical Exam There were no vitals filed for this visit. Estimated body mass index is 33.85 kg/m as calculated from the following:   Height as of 11/27/21: 5' 7.75" (1.721 m).   Weight as of 11/27/21: 221 lb (100.2 kg).  EKG (optional): deferred due to virtual visit  GENERAL: alert, oriented, no acute distress detected; full vision exam deferred due to pandemic and/or virtual encounter  PSYCH/NEURO: pleasant and cooperative, no obvious depression or anxiety, speech and thought processing grossly intact, Cognitive function grossly intact  Flowsheet Row Video Visit from 05/09/2022 in [facility] [place]  PHQ-9 Total Score 2           05/09/2022    4:28 PM 10/11/2021    8:31 AM  Depression screen PHQ 2/9  Decreased Interest 0 0  Down, Depressed, Hopeless 0 2  PHQ - 2 Score 0 2  Altered sleeping 1 0  Tired, decreased energy 1 1  Change in appetite 0 0  Feeling bad or failure about yourself  0 0  Trouble concentrating 0 0  Moving slowly or fidgety/restless 0 0  Suicidal thoughts 0 0  PHQ-9 Score 2 3  Difficult doing work/chores Not difficult at all Not difficult at all       10/11/2021    8:33 AM 05/09/2022    4:30 PM  Fall Risk  Falls in the past year? 0 0  Was there an injury with Fall? 0 0  Fall Risk Category Calculator 0 0  Fall Risk Category (Retired) Low   (RETIRED) Patient Fall Risk Level Low fall risk   Patient at Risk for Falls Due to History of fall(s) No Fall Risks  Fall risk Follow up Falls evaluation completed Falls evaluation completed     SUMMARY AND PLAN:  Encounter for Medicare annual wellness exam   Discussed applicable health maintenance/preventive health measures and advised and referred or ordered per patient preferences: -he feels had his colonoscopy with Dr. Madilyn Fireman, reports normal, advised staff to obtain  copy for PCP, reports per his records is due in 2025 -reports had tetanus booster in 2017 at [place] -reports had 2nd shingles vaccine in 2020 but no record -reports had several covid shots Health Maintenance  Topic Date Due   COLONOSCOPY (Pts 45-53yrs Insurance coverage will need to be confirmed)  Never done   Zoster Vaccines- Shingrix (2 of 2) 11/19/2017   Pneumonia Vaccine 30+ Years old (1 of 1 - PCV) Never done   COVID-19 Vaccine (1) 05/25/2022 (Originally 11/11/1955)   INFLUENZA VACCINE  08/15/2022   Medicare Annual Wellness (AWV)  05/09/2023   DTaP/Tdap/Td (2 - Td or Tdap) 08/31/2025   Hepatitis C Screening  Completed   HPV VACCINES  Aged [employer] and counseling on the following was provided based on the above review of health and a plan/checklist for the patient, along with additional information discussed, was provided for the patient in the patient instructions :  -Advised on importance of completing advanced directives, discussed options for completing and provided information in patient instructions as well -Provided counseling and plan for difficulty hearing  -Provided counseling and plan for increased risk of falling if applicable per above screening. Reviewed and demonstrated safe balance exercises that can be done at home to improve balance and discussed exercise guidelines for adults with include balance exercises at least 3 days per week.  -Advised and counseled on a healthy lifestyle - including the importance of a healthy diet, regular physical activity, social connections and stress management. -Reviewed patient's current diet. Advised and counseled on a whole foods based healthy diet. A summary of a healthy diet was provided in the Patient Instructions.  -reviewed patient's current physical activity level and discussed exercise guidelines for adults. Discussed community resources and ideas for safe exercise at home to assist in meeting exercise guideline  recommendations in a safe and healthy way.  -Advise yearly dental visits at minimum and regular eye exams -Advised and counseled on alcohol safe limits, risks/ tobacco use, risks of smoking and offered counseling/help, drug, opoid use/misuse   Follow up: see patient instructions   Patient Instructions  I really enjoyed getting to talk with you today! I am available on Tuesdays and Thursdays for virtual visits if you have any questions or concerns, or if I can be of any further assistance.   CHECKLIST FROM ANNUAL WELLNESS VISIT:  -Follow up (please call to schedule if not scheduled after visit):   -yearly for annual wellness visit with primary care office  Here is a list  of your preventive care/health maintenance measures and the plan for each if any are due:  PLAN For any measures below that may be due:  -please obtain your vaccine record and send [place] a copy -please obtain you last colonoscopy report and send [place] a copy  Health Maintenance  Topic Date Due   COLONOSCOPY (Pts 45-69yrs Insurance coverage will need to be confirmed)  Never done   Zoster Vaccines- Shingrix (2 of 2) 11/19/2017   Pneumonia Vaccine 71+ Years old (1 of 1 - PCV) Never done   COVID-19 Vaccine (1) 05/25/2022 (Originally 11/11/1955)   INFLUENZA VACCINE  08/15/2022   Medicare Annual Wellness (AWV)  05/09/2023   DTaP/Tdap/Td (2 - Td or Tdap) 08/31/2025   Hepatitis C Screening  Completed   HPV VACCINES  Aged Out    -See a dentist at least yearly  -Get your eyes checked and then per your eye specialist's recommendations  -Other issues addressed today:   -I have included below further information regarding a healthy whole foods based diet, physical activity guidelines for adults, stress management and opportunities for social connections. I hope you find this information useful.    -----------------------------------------------------------------------------------------------------------------------------------------------------------------------------------------------------------------------------------------------------------  NUTRITION: -eat real food: lots of colorful vegetables (half the plate) and fruits -5-7 servings of vegetables and fruits per day (fresh or steamed is best), exp. 2 servings of vegetables with lunch and dinner and 2 servings of fruit per day. Berries and greens such as kale and collards are great choices.  -consume on a regular basis: whole grains (make sure first ingredient on label contains the word "whole"), fresh fruits, fish, nuts, seeds, healthy oils (such as olive oil, avocado oil, grape seed oil) -may eat small amounts of dairy and lean meat on occasion, but avoid processed meats such as ham, bacon, lunch meat, etc. -drink water -try to avoid fast food and pre-packaged foods, processed meat -most experts advise limiting sodium to <  per day, should limit further is any chronic conditions such as high blood pressure, heart disease, diabetes, etc. The American Heart Association advised that <  is is ideal -try to avoid foods that contain any ingredients with names you do not recognize  -try to avoid sugar/sweets (except for the natural sugar that occurs in fresh fruit) -try to avoid sweet drinks -try to avoid white rice, white bread, pasta (unless whole grain), white or yellow potatoes  EXERCISE GUIDELINES FOR ADULTS: -if you wish to increase your physical activity, do so gradually and with the approval of your doctor -STOP and seek medical care immediately if you have any chest pain, chest discomfort or trouble breathing when starting or increasing exercise  -move and stretch your body, legs, feet and arms when sitting for long periods -Physical activity guidelines for optimal health in adults: -least 150 minutes per week of  aerobic exercise (can talk, but not sing) once approved by your doctor, 20-30 minutes of sustained activity or two 10 minute episodes of sustained activity every day.  -resistance training at least 2 days per week if approved by your doctor -balance exercises 3+ days per week:   Stand somewhere where you have something sturdy to hold onto if you lose balance.    1) lift up on toes, start with 5x per day and work up to 20x   2) stand and lift on leg straight out to the side so that foot is a few inches of the floor, start with 5x each side and work up to 20x each side  3) stand on one foot, start with 5 seconds each side and work up to 20 seconds on each side  If you need ideas or help with getting more active:  -Silver sneakers https://tools.silversneakers.com  -Walk with a Doc: http://www.duncan-williams.com/  -try to include resistance (weight lifting/strength building) and balance exercises twice per week: or the following link for ideas: http://castillo-powell.com/  BuyDucts.dk  STRESS MANAGEMENT: -can try meditating, or just sitting quietly with deep breathing while intentionally relaxing all parts of your body for 5 minutes daily -if you need further help with stress, anxiety or depression please follow up with your primary doctor or contact the wonderful folks at [facility]: [phone]  SOCIAL CONNECTIONS: -options in [place] if you wish to engage in more social and exercise related activities:  -Silver sneakers https://tools.silversneakers.com  -Walk with a Doc: http://www.duncan-williams.com/  -Check out the [facility] Active Adults 50+ section on the [place] of [employer] (hiking clubs, book clubs, cards and games, chess, exercise classes, aquatic classes and much more) - see the website for  details: https://www.[place].gov/departments/parks-recreation/active-adults50  -YouTube has lots of exercise videos for different ages and abilities as well  -[facility] (a variety of indoor and outdoor inperson activities for adults). [phone]. [address]  -Virtual Online Classes (a variety of topics): see seniorplanet.org or call [phone]  -consider volunteering at a school, hospice center, church, senior center or elsewhere         ADVANCED HEALTHCARE DIRECTIVES:  Everyone should have advanced health care directives in place. This is so that you get the care you want, should you ever be in a situation where you are unable to make your own medical decisions.   From the [place] Advanced Directive Website: "Advance Health Care Directives are legal documents in which you give written instructions about your health care if, in the future, you cannot speak for yourself.   A health care power of attorney allows you to name a person you trust to make your health care decisions if you cannot make them yourself. A declaration of a desire for a natural death (or living will) is document, which states that you desire not to have your life prolonged by extraordinary measures if you have a terminal or incurable illness or if you are in a vegetative state. An advance instruction for mental health treatment makes a declaration of instructions, information and preferences regarding your mental health treatment. It also states that you are aware that the advance instruction authorizes a mental health treatment provider to act according to your wishes. It may also outline your consent or refusal of mental health treatment. A declaration of an anatomical gift allows anyone over the age of 57 to make a gift by will, organ donor card or other document."   Please see the following website or an elder law attorney for forms, FAQs and for completion of advanced  directives: [place] TEFL teacher Health Care Directives Advance Health Care Directives (http://guzman.com/)  Or copy and paste the following to your web browser: PoshChat.fi    Terressa Koyanagi, DO

## 2022-05-10 ENCOUNTER — Telehealth: Payer: Self-pay

## 2022-05-10 NOTE — Telephone Encounter (Signed)
Record request sent

## 2022-05-10 NOTE — Telephone Encounter (Signed)
-----   Message from Terressa Koyanagi, DO sent at 05/09/2022  5:50 PM EDT ----- Please request the last colonoscopy report for Bryan Hampton and have it sent to his PCP. He saw Dr. Dorena Cookey at [facility] and believes last colonoscopy was about 6-8 years. Please let patient know if he is due for his colonosocpy.

## 2022-07-22 DIAGNOSIS — D225 Melanocytic nevi of trunk: Secondary | ICD-10-CM | POA: Diagnosis not present

## 2022-07-22 DIAGNOSIS — L280 Lichen simplex chronicus: Secondary | ICD-10-CM | POA: Diagnosis not present

## 2022-07-22 DIAGNOSIS — L218 Other seborrheic dermatitis: Secondary | ICD-10-CM | POA: Diagnosis not present

## 2022-08-14 DIAGNOSIS — M19011 Primary osteoarthritis, right shoulder: Secondary | ICD-10-CM | POA: Diagnosis not present

## 2022-08-29 IMAGING — MR MR THORACIC SPINE WO/W CM
8 of 10 series · 38 of 48 positions shown · IV contrast (multihance)
Comparison: None.

CLINICAL DATA: Palpable soft tissue mass over the thoracic spine.

EXAM:
MRI THORACIC WITHOUT AND WITH CONTRAST
TECHNIQUE: Multiplanar and multiecho pulse sequences of the thoracic spine were
obtained without and with intravenous contrast.
CONTRAST:  20mL MULTIHANCE GADOBENATE DIMEGLUMINE 529 MG/ML IV SOLN

[Series 24: T2 · axial · 4.0mm · 0.35mm/px · z∈[-108,+77]mm · 6 of 38 slices shown (1 of 3)]
[im 1/38]
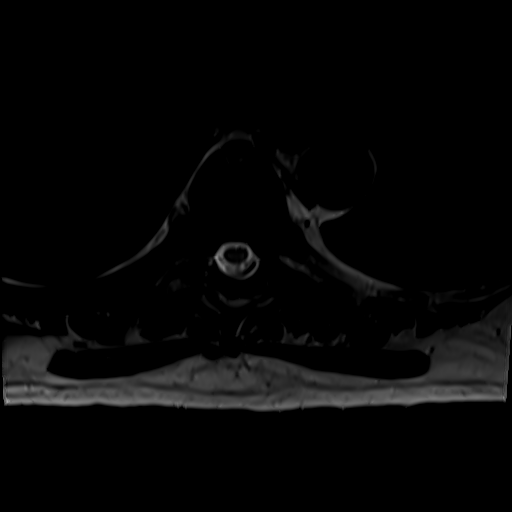
[im 8/38]
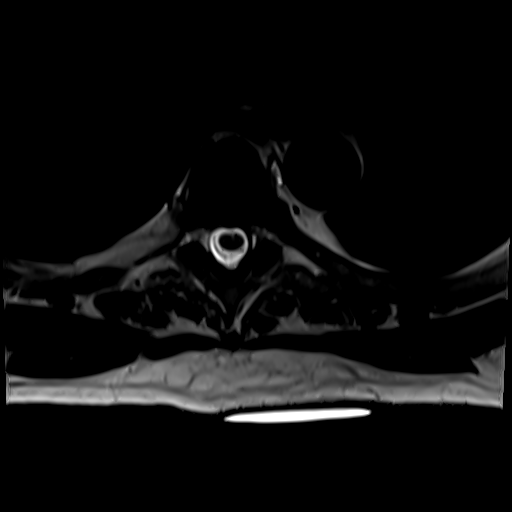
[im 15/38]
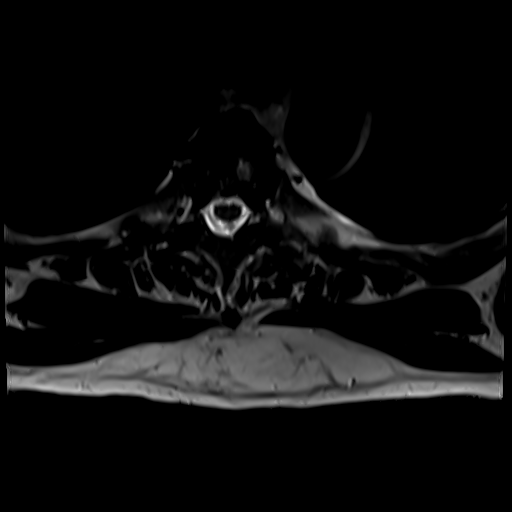
[im 23/38]
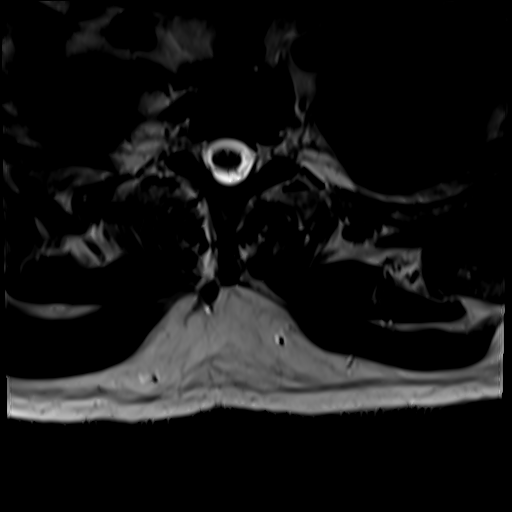
[im 30/38]
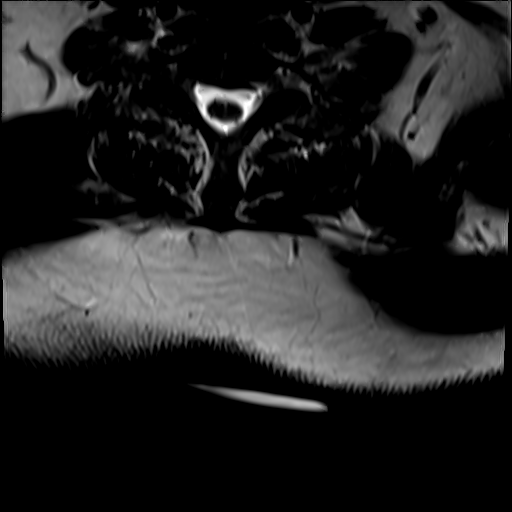
[im 38/38]
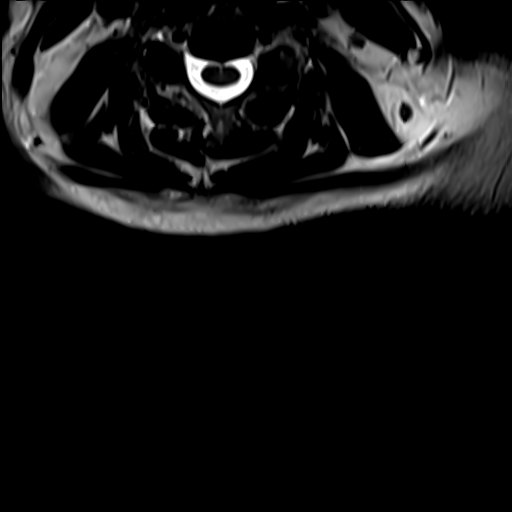

[Series 26: T1 · axial · non-contrast · 4.0mm · 0.70mm/px · z∈[-100,+85]mm · 6 of 38 slices shown (1 of 2)]
[im 1/38]
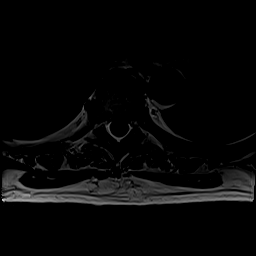
[im 8/38]
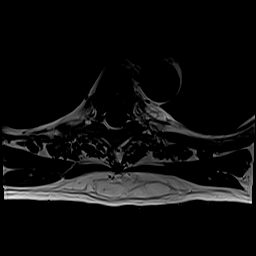
[im 15/38]
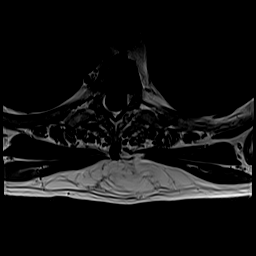
[im 23/38]
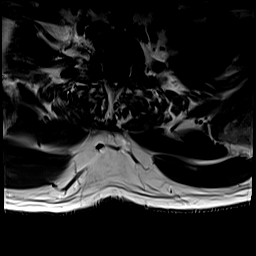
[im 30/38]
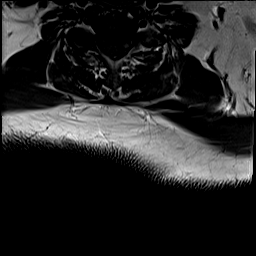
[im 38/38]
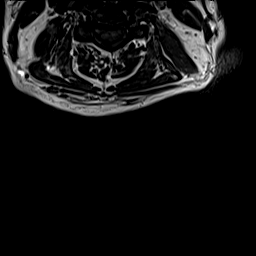

[Series 27: T1 · sagittal · 5.0mm · 1.25mm/px · 4 of 22 slices shown (2 of 2)]
[im 1/22]
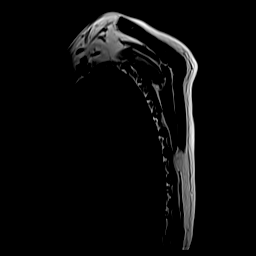
[im 8/22]
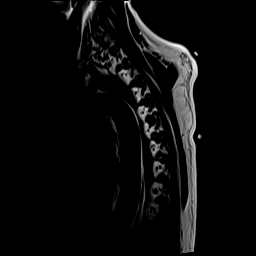
[im 15/22]
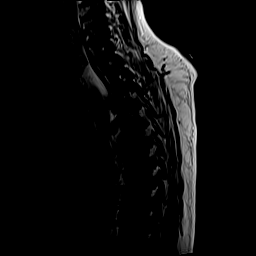
[im 22/22]
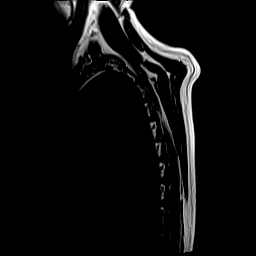

[Series 28: STIR · sagittal · 5.0mm · 1.00mm/px · 4 of 22 slices shown]
[im 1/22]
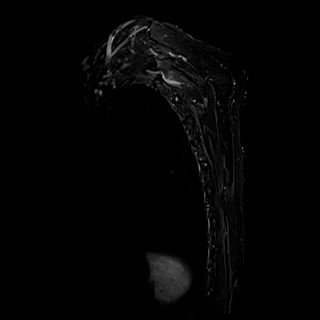
[im 8/22]
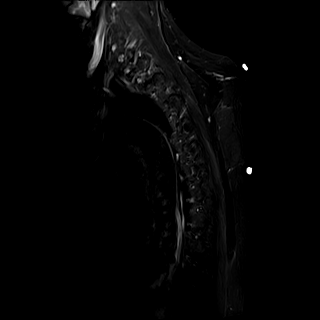
[im 15/22]
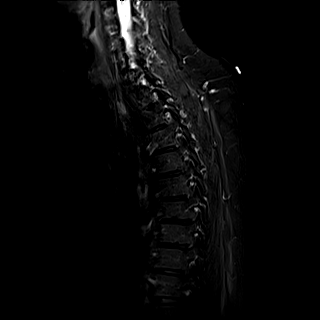
[im 22/22]
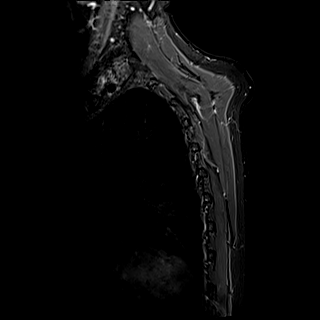

[Series 29: T2 · sagittal · 5.0mm · 0.83mm/px · 4 of 22 slices shown (2 of 3)]
[im 1/22]
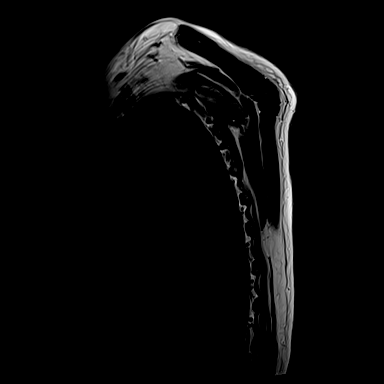
[im 8/22]
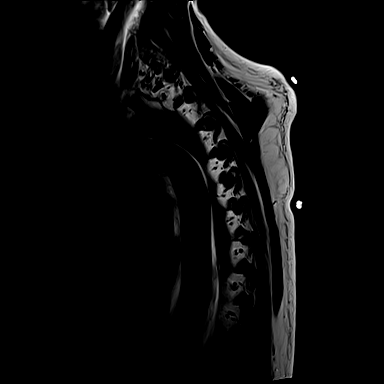
[im 15/22]
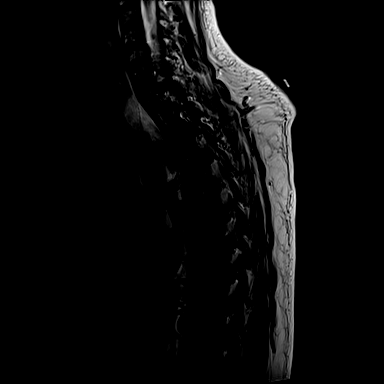
[im 22/22]
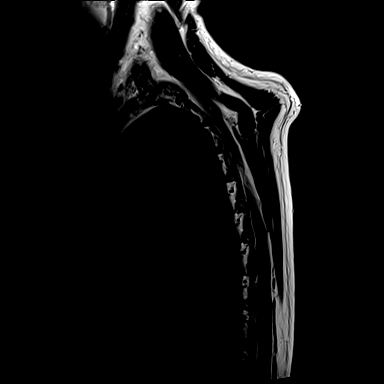

[Series 30: T2 · sagittal · 5.0mm · 0.83mm/px · 4 of 22 slices shown (3 of 3)]
[im 1/22]
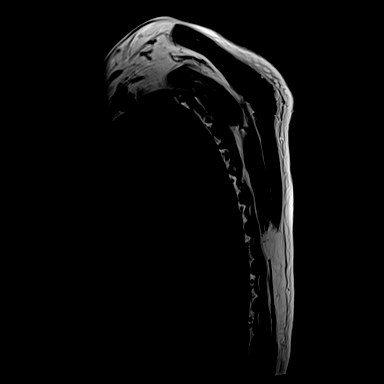
[im 8/22]
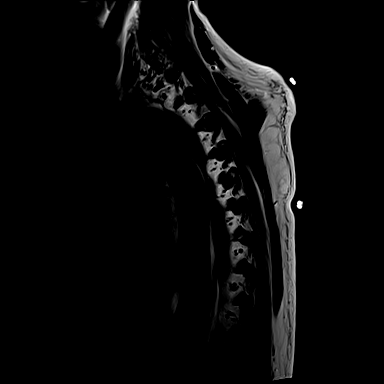
[im 15/22]
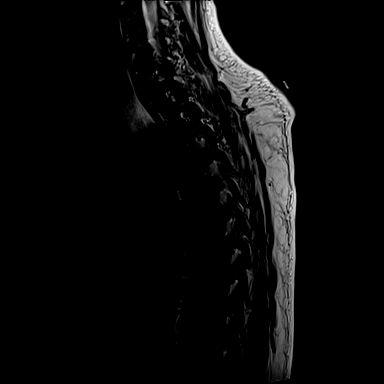
[im 22/22]
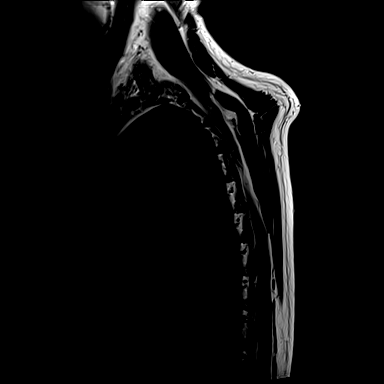

[Series 31: T1 fat-sat · sagittal · 5.0mm · 1.00mm/px · 4 of 22 slices shown]
[im 1/22]
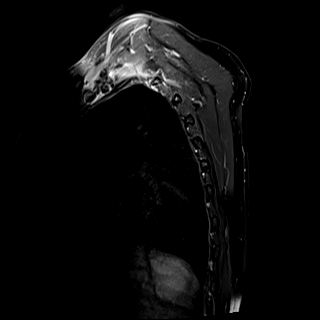
[im 8/22]
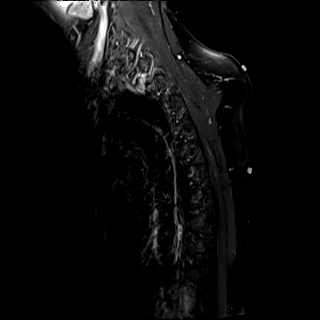
[im 15/22]
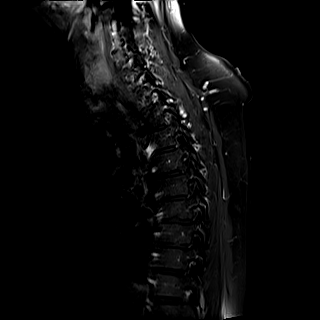
[im 22/22]
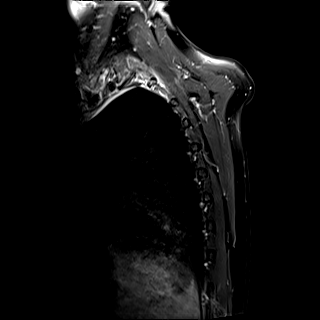

[Series 32: T1 post-contrast · axial · 4.0mm · 0.70mm/px · z∈[-108,+77]mm · 6 of 38 slices shown]
[im 1/38]
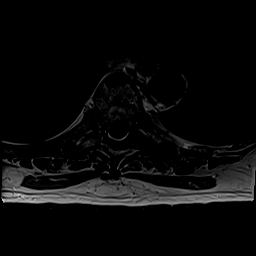
[im 8/38]
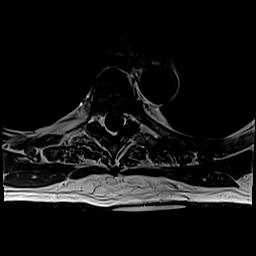
[im 15/38]
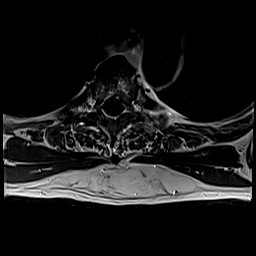
[im 23/38]
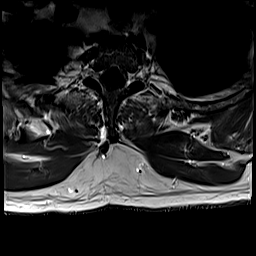
[im 30/38]
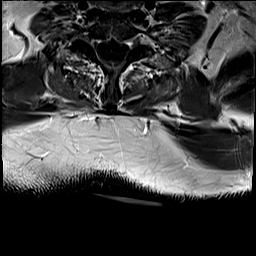
[im 38/38]
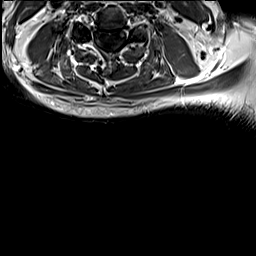

[38 of 48 positions shown; findings below may reference images not displayed]

FINDINGS: MRI THORACIC SPINE FINDINGS

Alignment: No significant listhesis is present in the
cervicothoracic spine to the lowest imaged level, T10-11.

Vertebrae: Hemangioma along the superior endplate of L4 measures 8
mm. Schmorl's nodes are present at T7-8, T8-9, T9-10, and T10-11.
Marrow signal and vertebral body heights are otherwise normal.

Cord: Normal signal and morphology is present throughout the
visualized spinal cord to the level of T11. No pathologic
enhancement is present.

Paraspinal and other soft tissues: Paraspinous soft tissues are
within normal limits. Focal subcutaneous fat deposition is noted
over the upper thoracic spine subjacent to the area marked. This
extends from approximately C7 through T5-6. No discrete or enhancing
lesion is present. Prominent draining vein is associated. No other
discrete soft tissue abnormalities are present. Visualized lung
fields are clear.

Disc levels:

No significant focal disc disease is present. The central canal and
foramina are patent throughout the thoracic spine. Mild foraminal
narrowing is present at C5-6 and C6-7, left greater than right.
IMPRESSION: 1. Benign-appearing subcutaneous fat deposition over the upper
thoracic spine as described. No discrete or enhancing lesion is
present.
2. Mild foraminal narrowing at C5-6 and C6-7, left greater than
right.
3. No significant focal disc disease or stenosis.

## 2022-11-29 ENCOUNTER — Ambulatory Visit (INDEPENDENT_AMBULATORY_CARE_PROVIDER_SITE_OTHER): Payer: Medicare PPO | Admitting: Adult Health

## 2022-11-29 ENCOUNTER — Encounter: Payer: Self-pay | Admitting: Adult Health

## 2022-11-29 VITALS — BP 120/80 | HR 69 | Temp 98.2°F | Ht 67.5 in | Wt 224.0 lb

## 2022-11-29 DIAGNOSIS — Z125 Encounter for screening for malignant neoplasm of prostate: Secondary | ICD-10-CM | POA: Diagnosis not present

## 2022-11-29 DIAGNOSIS — I10 Essential (primary) hypertension: Secondary | ICD-10-CM

## 2022-11-29 DIAGNOSIS — E66811 Obesity, class 1: Secondary | ICD-10-CM | POA: Diagnosis not present

## 2022-11-29 DIAGNOSIS — R7303 Prediabetes: Secondary | ICD-10-CM

## 2022-11-29 DIAGNOSIS — Z23 Encounter for immunization: Secondary | ICD-10-CM | POA: Diagnosis not present

## 2022-11-29 DIAGNOSIS — Z1211 Encounter for screening for malignant neoplasm of colon: Secondary | ICD-10-CM

## 2022-11-29 DIAGNOSIS — Z0000 Encounter for general adult medical examination without abnormal findings: Secondary | ICD-10-CM

## 2022-11-29 LAB — CBC
HCT: 47.2 % (ref 39.0–52.0)
Hemoglobin: 16.1 g/dL (ref 13.0–17.0)
MCHC: 34.2 g/dL (ref 30.0–36.0)
MCV: 92.2 fL (ref 78.0–100.0)
Platelets: 207 10*3/uL (ref 150.0–400.0)
RBC: 5.12 Mil/uL (ref 4.22–5.81)
RDW: 13.2 % (ref 11.5–15.5)
WBC: 6.8 10*3/uL (ref 4.0–10.5)

## 2022-11-29 LAB — LIPID PANEL
Cholesterol: 160 mg/dL (ref 0–200)
HDL: 41.9 mg/dL (ref >39.00)
LDL Cholesterol: 97 mg/dL (ref 0–99)
NonHDL: 118.55
Total CHOL/HDL Ratio: 4
Triglycerides: 107 mg/dL (ref 0.0–149.0)
VLDL: 21.4 mg/dL (ref 0.0–40.0)

## 2022-11-29 LAB — COMPREHENSIVE METABOLIC PANEL
ALT: 14 U/L (ref 0–53)
AST: 14 U/L (ref 0–37)
Albumin: 4.3 g/dL (ref 3.5–5.2)
Alkaline Phosphatase: 71 U/L (ref 39–117)
BUN: 16 mg/dL (ref 6–23)
CO2: 31 meq/L (ref 19–32)
Calcium: 9.4 mg/dL (ref 8.4–10.5)
Chloride: 103 meq/L (ref 96–112)
Creatinine, Ser: 0.84 mg/dL (ref 0.40–1.50)
GFR: 90.21 mL/min (ref >60.00)
Glucose, Bld: 105 mg/dL — ABNORMAL HIGH (ref 70–99)
Potassium: 4.4 meq/L (ref 3.5–5.1)
Sodium: 141 meq/L (ref 135–145)
Total Bilirubin: 0.8 mg/dL (ref 0.2–1.2)
Total Protein: 6.6 g/dL (ref 6.0–8.3)

## 2022-11-29 LAB — HEMOGLOBIN A1C: Hgb A1c MFr Bld: 5.9 % (ref 4.6–6.5)

## 2022-11-29 LAB — PSA: PSA: 2.12 ng/mL (ref 0.10–4.00)

## 2022-11-29 MED ORDER — LOSARTAN POTASSIUM 100 MG PO TABS: 100.0000 mg | ORAL_TABLET | Freq: Every day | ORAL | 3 refills | Status: DC

## 2022-11-29 NOTE — Progress Notes (Signed)
Subjective:    Patient ID: Bryan Hampton, male    DOB: [birth year], 67 y.o.   MRN: [id number]  HPI Patient presents for yearly preventative medicine examination. He is a 67 year old male who  has a past medical history of Hypertension.  HTN -he is managed with Cozaar 100 mg daily.  He denies dizziness, lightheadedness, chest pain, or shortness of breath.  He does not check his blood pressure at home BP Readings from Last 3 Encounters:  11/29/22 120/80  11/27/21 130/80  10/11/21 132/84   Prediabetes - not currently on medication.  Lab Results  Component Value Date   HGBA1C 5.8 11/27/2021   All immunizations and health maintenance protocols were reviewed with the patient and needed orders were placed.  Appropriate screening laboratory values were ordered for the patient including screening of hyperlipidemia, renal function and hepatic function. If indicated by BPH, a PSA was ordered.  Medication reconciliation,  past medical history, social history, problem list and allergies were reviewed in detail with the patient  Goals were established with regard to weight loss, exercise, and  diet in compliance with medications  Wt Readings from Last 3 Encounters:  11/29/22 224 lb (101.6 kg)  11/27/21 221 lb (100.2 kg)  10/11/21 223 lb (101.2 kg)     He will be due for his next colonoscopy in 03/2023. His previous colonoscopy was done at [facility]. He would like to switch to [facility].    Review of Systems  Constitutional: Negative.   HENT: Negative.    Eyes: Negative.   Respiratory: Negative.    Cardiovascular: Negative.   Gastrointestinal: Negative.   Endocrine: Negative.   Genitourinary: Negative.   Musculoskeletal: Negative.   Skin: Negative.   Allergic/Immunologic: Negative.   Neurological: Negative.   Hematological: Negative.   Psychiatric/Behavioral: Negative.    All other systems reviewed and are negative.  Past Medical History:  Diagnosis Date   Hypertension      Social History   Socioeconomic History   Marital status: Married    Spouse name: Not on file   Number of children: Not on file   Years of education: Not on file   Highest education level: Not on file  Occupational History   Not on file  Tobacco Use   Smoking status: Former    Current packs/day: 0.00    Types: Cigarettes    Quit date: 03/20/1988    Years since quitting: 34.7   Smokeless tobacco: Not on file  Vaping Use   Vaping status: Never Used  Substance and Sexual Activity   Alcohol use: Yes    Alcohol/week: 13.0 standard drinks of alcohol    Types: 12 Cans of beer, 1 Shots of liquor per week    Comment: liqouor once a month   Drug use: No   Sexual activity: Not on file  Other Topics Concern   Not on file  Social History Narrative   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 Strain: Not on file  Food Insecurity: Not on file  Transportation Needs: Not on file  Physical Activity: Not on file  Stress: Not on file  Social Connections: Not on file  Intimate Partner Violence: Not on file    Past Surgical History:  Procedure Laterality Date   ANAL FISSURE REPAIR  2000   HERNIA REPAIR  2006, 2007    Family History  Problem Relation Age of Onset   Alzheimer's disease Maternal Grandmother  Heart disease Maternal Grandfather    Heart disease Paternal Grandfather     Allergies  Allergen Reactions   Penicillins Hives and Itching    Current Outpatient Medications on File Prior to Visit  Medication Sig Dispense Refill   ibuprofen (ADVIL,MOTRIN) 200 MG tablet Take 400 mg by mouth as needed (pain).     losartan (COZAAR) 100 MG tablet Take 1 tablet (100 mg total) by mouth daily. 90 tablet 3   No current facility-administered medications on file prior to visit.    BP 120/80   Pulse 69   Temp 98.2 F (36.8 C) (Oral)   Ht 5' 7.5" (1.715 m)   Wt 224 lb (101.6 kg)   SpO2 96%   BMI 34.57 kg/m       Objective:   Physical Exam Vitals  and nursing note reviewed.  Constitutional:      General: He is not in acute distress.    Appearance: Normal appearance. He is not ill-appearing.  HENT:     Head: Normocephalic and atraumatic.     Right Ear: Tympanic membrane, ear canal and external ear normal. There is no impacted cerumen.     Left Ear: Tympanic membrane, ear canal and external ear normal. There is no impacted cerumen.     Nose: Nose normal. No congestion or rhinorrhea.     Mouth/Throat:     Mouth: Mucous membranes are moist.     Pharynx: Oropharynx is clear.  Eyes:     Extraocular Movements: Extraocular movements intact.     Conjunctiva/sclera: Conjunctivae normal.     Pupils: Pupils are equal, round, and reactive to light.  Neck:     Vascular: No carotid bruit.  Cardiovascular:     Rate and Rhythm: Normal rate and regular rhythm.     Pulses: Normal pulses.     Heart sounds: No murmur heard.    No friction rub. No gallop.  Pulmonary:     Effort: Pulmonary effort is normal.     Breath sounds: Normal breath sounds.  Abdominal:     General: Abdomen is flat. Bowel sounds are normal. There is no distension.     Palpations: Abdomen is soft. There is no mass.     Tenderness: There is no abdominal tenderness. There is no guarding or rebound.     Hernia: No hernia is present.  Musculoskeletal:        General: Normal range of motion.     Cervical back: Normal range of motion and neck supple.  Lymphadenopathy:     Cervical: No cervical adenopathy.  Skin:    General: Skin is warm and dry.     Capillary Refill: Capillary refill takes less than 2 seconds.  Neurological:     General: No focal deficit present.     Mental Status: He is alert and oriented to person, place, and time.  Psychiatric:        Mood and Affect: Mood normal.        Behavior: Behavior normal.        Thought Content: Thought content normal.        Judgment: Judgment normal.       Assessment & Plan:  1. Routine general medical examination at a  health care facility Today patient counseled on age appropriate routine health concerns for screening and prevention, each reviewed and up to date or declined. Immunizations reviewed and up to date or declined. Labs ordered and reviewed. Risk factors for depression reviewed and negative. Hearing  function and visual acuity are intact. ADLs screened and addressed as needed. Functional ability and level of safety reviewed and appropriate. Education, counseling and referrals performed based on assessed risks today. Patient provided with a copy of personalized plan for preventive services. - Needs to work on weight loss through diet and exercise - Follow up in one year  - Flu shot given   2. Primary hypertension - Well controlled. No change in medication  - Lipid panel; Future - CBC; Future - Comprehensive metabolic panel; Future - losartan (COZAAR) 100 MG tablet; Take 1 tablet (100 mg total) by mouth daily.  Dispense: 90 tablet; Refill: 3  3. Prediabetes - Consider adding Metformin  - Lipid panel; Future - CBC; Future - Comprehensive metabolic panel; Future - Hemoglobin A1c; Future  4. Prostate cancer screening  - PSA; Future  5. Colon cancer screening  - Ambulatory referral to Gastroenterology  6. Class 1 obesity - Encouraged weight loss trough diet and exercise - Lipid panel; Future - CBC; Future - Comprehensive metabolic panel; Future - Hemoglobin A1c; Future  Shirline Frees, NP

## 2022-11-29 NOTE — Patient Instructions (Signed)
It was great seeing you today   We will follow up with you regarding your lab work   Please let me know if you need anything   Please work on weight loss through diet and exercise   

## 2022-11-29 NOTE — Addendum Note (Signed)
Addended by: Waymon Amato R on: 11/29/2022 08:44 AM   Modules accepted: Orders

## 2023-02-04 ENCOUNTER — Encounter: Payer: Self-pay | Admitting: Internal Medicine

## 2023-03-05 ENCOUNTER — Ambulatory Visit (AMBULATORY_SURGERY_CENTER): Payer: Medicare PPO | Admitting: *Deleted

## 2023-03-05 VITALS — Ht 67.5 in | Wt 225.0 lb

## 2023-03-05 DIAGNOSIS — Z1211 Encounter for screening for malignant neoplasm of colon: Secondary | ICD-10-CM

## 2023-03-05 MED ORDER — SUFLAVE 178.7 G PO SOLR: 1.0000 | Freq: Once | ORAL | 0 refills | Status: DC

## 2023-03-05 MED ORDER — SUFLAVE 178.7 G PO SOLR: 1.0000 | Freq: Once | ORAL | 0 refills | Status: AC

## 2023-03-05 NOTE — Progress Notes (Addendum)
 Pt's name and DOB verified at the beginning of the pre-visit wit 2 identifiers  Pt denies any difficulty with ambulating,sitting, laying down or rolling side to side  Pt has no issues with ambulation   Pt has no issues moving head neck or swallowing  No egg or soy allergy known to patient   No issues known to pt with past sedation with any surgeries or procedures  Pt denies having issues being intubated  No FH of Malignant Hyperthermia  Pt is not on diet pills or shots  Pt is not on home 02   Pt is not on blood thinners   Pt denies issues with constipation   Pt is not on dialysis  Pt denise any abnormal heart rhythms   Pt denies any upcoming cardiac testing  Patient's chart reviewed by Cathlyn Parsons CNRA prior to pre-visit and patient appropriate for the [facility].  Pre-visit completed and red dot placed by patient's name on their procedure day (on provider's schedule).     Visit by phone  Pt states weight is 225lb  IInstructions reviewed. Pt given [facility], [facility] main # and MD on call # prior to instructions.  Pt states understanding of instructions. Instructed to review again prior to procedure. Pt states they will.   Informed pt that they will receive a text or  call from [facility] regarding there prep med.

## 2023-03-19 ENCOUNTER — Telehealth: Payer: Self-pay | Admitting: Internal Medicine

## 2023-03-19 ENCOUNTER — Encounter: Payer: Self-pay | Admitting: Internal Medicine

## 2023-03-19 DIAGNOSIS — Z1211 Encounter for screening for malignant neoplasm of colon: Secondary | ICD-10-CM

## 2023-03-19 MED ORDER — NA SULFATE-K SULFATE-MG SULF 17.5-3.13-1.6 GM/177ML PO SOLN: 1.0000 | Freq: Once | ORAL | 0 refills | Status: DC

## 2023-03-19 MED ORDER — NA SULFATE-K SULFATE-MG SULF 17.5-3.13-1.6 GM/177ML PO SOLN: 1.0000 | Freq: Once | ORAL | 0 refills | Status: AC

## 2023-03-19 NOTE — Telephone Encounter (Signed)
 Rx for suprep sent to requested pharmacy. VM left making the patient aware. New prep instructions sent via mychart hard copy will be mailed to address on file.

## 2023-03-19 NOTE — Telephone Encounter (Signed)
 Patient called in stating he's very upset with using [facility] and would rather have the Suflave medication sent to the [facility] on [address]. Thank you.

## 2023-03-25 ENCOUNTER — Encounter: Payer: Self-pay | Admitting: Internal Medicine

## 2023-03-31 ENCOUNTER — Encounter: Payer: Self-pay | Admitting: Internal Medicine

## 2023-03-31 ENCOUNTER — Ambulatory Visit (AMBULATORY_SURGERY_CENTER): Payer: Medicare PPO | Admitting: Internal Medicine

## 2023-03-31 VITALS — BP 103/61 | HR 71 | Temp 98.1°F | Resp 16 | Ht 67.5 in | Wt 225.0 lb

## 2023-03-31 DIAGNOSIS — I10 Essential (primary) hypertension: Secondary | ICD-10-CM | POA: Diagnosis not present

## 2023-03-31 DIAGNOSIS — K5730 Diverticulosis of large intestine without perforation or abscess without bleeding: Secondary | ICD-10-CM

## 2023-03-31 DIAGNOSIS — Z1211 Encounter for screening for malignant neoplasm of colon: Secondary | ICD-10-CM

## 2023-03-31 DIAGNOSIS — D120 Benign neoplasm of cecum: Secondary | ICD-10-CM

## 2023-03-31 DIAGNOSIS — K648 Other hemorrhoids: Secondary | ICD-10-CM

## 2023-03-31 MED ORDER — SODIUM CHLORIDE 0.9 % IV SOLN: 500.0000 mL | Freq: Once | INTRAVENOUS | Status: DC

## 2023-03-31 NOTE — Patient Instructions (Addendum)
-  Handout on polyps, hemorrhoids, diverticulosis provided -await pathology results -repeat colonoscopy for surveillance recommended. Date to be determined when pathology result become available   -Continue present medications   YOU HAD AN ENDOSCOPIC PROCEDURE TODAY AT THE [facility]:   Refer to the procedure report that was given to you for any specific questions about what was found during the examination.  If the procedure report does not answer your questions, please call your gastroenterologist to clarify.  If you requested that your care partner not be given the details of your procedure findings, then the procedure report has been included in a sealed envelope for you to review at your convenience later.  YOU SHOULD EXPECT: Some feelings of bloating in the abdomen. Passage of more gas than usual.  Walking can help get rid of the air that was put into your GI tract during the procedure and reduce the bloating. If you had a lower endoscopy (such as a colonoscopy or flexible sigmoidoscopy) you may notice spotting of blood in your stool or on the toilet paper. If you underwent a bowel prep for your procedure, you may not have a normal bowel movement for a few days.  Please Note:  You might notice some irritation and congestion in your nose or some drainage.  This is from the oxygen used during your procedure.  There is no need for concern and it should clear up in a day or so.  SYMPTOMS TO REPORT IMMEDIATELY:  Following lower endoscopy (colonoscopy or flexible sigmoidoscopy):  Excessive amounts of blood in the stool  Significant tenderness or worsening of abdominal pains  Swelling of the abdomen that is new, acute  Fever of 100F or higher  For urgent or emergent issues, a gastroenterologist can be reached at any hour by calling [phone]. Do not use MyChart messaging for urgent concerns.    DIET:  We do recommend a small meal at first, but then you may proceed to your  regular diet.  Drink plenty of fluids but you should avoid alcoholic beverages for 24 hours.  ACTIVITY:  You should plan to take it easy for the rest of today and you should NOT DRIVE or use heavy machinery until tomorrow (because of the sedation medicines used during the test).    FOLLOW UP: Our staff will call the number listed on your records the next business day following your procedure.  We will call around 7:15- 8:00 am to check on you and address any questions or concerns that you may have regarding the information given to you following your procedure. If we do not reach you, we will leave a message.     If any biopsies were taken you will be contacted by phone or by letter within the next 1-3 weeks.  Please call us at [phone] if you have not heard about the biopsies in 3 weeks.    SIGNATURES/CONFIDENTIALITY: You and/or your care partner have signed paperwork which will be entered into your electronic medical record.  These signatures attest to the fact that that the information above on your After Visit Summary has been reviewed and is understood.  Full responsibility of the confidentiality of this discharge information lies with you and/or your care-partner.

## 2023-03-31 NOTE — Progress Notes (Signed)
 Pt's states no medical or surgical changes since previsit or office visit.

## 2023-03-31 NOTE — Progress Notes (Signed)
 HISTORY OF PRESENT ILLNESS:  Bryan Hampton is a 68 y.o. male who presents for screening colonoscopy.  Colonoscopy 10 years ago elsewhere was negative for neoplasia  REVIEW OF SYSTEMS:  All non-GI ROS negative except for  Past Medical History:  Diagnosis Date   Cataract    Hypertension     Past Surgical History:  Procedure Laterality Date   ANAL FISSURE REPAIR  01/14/1998   COLOSTOMY     HERNIA REPAIR  2006, 2007    Social History Bryan Hampton  reports that he quit smoking about 35 years ago. His smoking use included cigarettes. He has quit using smokeless tobacco.  His smokeless tobacco use included snuff. He reports current alcohol use of about 13.0 standard drinks of alcohol per week. He reports that he does not use drugs.  family history includes Alzheimer's disease in his maternal grandmother; Heart disease in his maternal grandfather and paternal grandfather.  Allergies  Allergen Reactions   Penicillins Hives and Itching       PHYSICAL EXAMINATION: Vital signs: BP 139/74   Pulse 68   Temp 98.1 F (36.7 C) (Temporal)   Ht 5' 7.5" (1.715 m)   Wt 225 lb (102.1 kg)   SpO2 97%   BMI 34.72 kg/m  General: Well-developed, well-nourished, no acute distress HEENT: Sclerae are anicteric, conjunctiva pink. Oral mucosa intact Lungs: Clear Heart: Regular Abdomen: soft, nontender, nondistended, no obvious ascites, no peritoneal signs, normal bowel sounds. No organomegaly. Extremities: No edema Psychiatric: alert and oriented x3. Cooperative     ASSESSMENT:  Colon cancer screening   PLAN:   Screening colonoscopy

## 2023-03-31 NOTE — Progress Notes (Signed)
 Called to room to assist during endoscopic procedure.  Patient ID and intended procedure confirmed with present staff. Received instructions for my participation in the procedure from the performing physician.

## 2023-03-31 NOTE — Op Note (Signed)
 [facility] Patient Name: Bryan Hampton Procedure Date: 03/31/2023 9:07 AM MRN: [id number] Endoscopist: Wilhemina Bonito. Marina Goodell , MD, [id number] Age: 68 Referring MD:  Date of Birth: [birth year] Gender: Male Account #: [id number] Procedure:                Colonoscopy with cold snare polypectomy x 1 Indications:              Screening for colorectal malignant neoplasm.                            Previous exam elsewhere 10 years ago was negative                            for neoplasia Medicines:                Monitored Anesthesia Care Procedure:                Pre-Anesthesia Assessment:                           - Prior to the procedure, a History and Physical                            was performed, and patient medications and                            allergies were reviewed. The patient's tolerance of                            previous anesthesia was also reviewed. The risks                            and benefits of the procedure and the sedation                            options and risks were discussed with the patient.                            All questions were answered, and informed consent                            was obtained. Prior Anticoagulants: The patient has                            taken no anticoagulant or antiplatelet agents. ASA                            Grade Assessment: II - A patient with mild systemic                            disease. After reviewing the risks and benefits,                            the patient was deemed in satisfactory condition to  undergo the procedure.                           After obtaining informed consent, the colonoscope                            was passed under direct vision. Throughout the                            procedure, the patient's blood pressure, pulse, and                            oxygen saturations were monitored continuously. The                            Olympus Scope  [id number] was introduced through the                            anus and advanced to the the cecum, identified by                            appendiceal orifice and ileocecal valve. The                            ileocecal valve, appendiceal orifice, and rectum                            were photographed. The quality of the bowel                            preparation was excellent. The colonoscopy was                            performed without difficulty. The patient tolerated                            the procedure well. The bowel preparation used was                            SUPREP via split dose instruction. Scope In: 9:31:11 AM Scope Out: 9:40:08 AM Scope Withdrawal Time: 0 hours 7 minutes 20 seconds  Total Procedure Duration: 0 hours 8 minutes 57 seconds  Findings:                 A 3 mm polyp was found in the cecum. The polyp was                            removed with a cold snare. Resection and retrieval                            were complete.                           Many diverticula were found in the entire colon.  Internal hemorrhoids were found during                            retroflexion. The hemorrhoids were small.                           The exam was otherwise without abnormality on                            direct and retroflexion views. Complications:            No immediate complications. Estimated blood loss:                            None. Estimated Blood Loss:     Estimated blood loss: none. Impression:               - One 3 mm polyp in the cecum, removed with a cold                            snare. Resected and retrieved.                           - Diverticulosis in the entire examined colon.                           - Internal hemorrhoids.                           - The examination was otherwise normal on direct                            and retroflexion views. Recommendation:           - Repeat colonoscopy in 7-10  years for surveillance.                           - Patient has a contact number available for                            emergencies. The signs and symptoms of potential                            delayed complications were discussed with the                            patient. Return to normal activities tomorrow.                            Written discharge instructions were provided to the                            patient.                           - Resume previous diet.                           -  Continue present medications.                           - Await pathology results. Wilhemina Bonito. Marina Goodell, MD 03/31/2023 9:47:06 AM This report has been signed electronically.

## 2023-03-31 NOTE — Progress Notes (Signed)
 Sedate, gd SR, tolerated procedure well, VSS, report to RN

## 2023-04-01 ENCOUNTER — Telehealth: Payer: Self-pay

## 2023-04-01 NOTE — Telephone Encounter (Signed)
  Follow up Call-     03/31/2023    7:45 AM  Call back number  Post procedure Call Back phone  # [phone]  Permission to leave phone message Yes     Patient questions:  Do you have a fever, pain , or abdominal swelling? No. Pain Score  0 *  Have you tolerated food without any problems? Yes.    Have you been able to return to your normal activities? Yes.    Do you have any questions about your discharge instructions: Diet   No. Medications  No. Follow up visit  No.  Do you have questions or concerns about your Care? No.  Actions: * If pain score is 4 or above: No action needed, pain <4.

## 2023-04-02 ENCOUNTER — Encounter: Payer: Self-pay | Admitting: Internal Medicine

## 2023-04-02 LAB — SURGICAL PATHOLOGY

## 2023-08-08 DIAGNOSIS — H43391 Other vitreous opacities, right eye: Secondary | ICD-10-CM | POA: Diagnosis not present

## 2023-08-20 ENCOUNTER — Ambulatory Visit (INDEPENDENT_AMBULATORY_CARE_PROVIDER_SITE_OTHER)

## 2023-08-20 VITALS — Ht 67.5 in | Wt 224.0 lb

## 2023-08-20 DIAGNOSIS — Z0000 Encounter for general adult medical examination without abnormal findings: Secondary | ICD-10-CM

## 2023-08-20 NOTE — Patient Instructions (Signed)
 Bryan Hampton , Thank you for taking time out of your busy schedule to complete your Annual Wellness Visit with me. I enjoyed our conversation and look forward to speaking with you again next year. I, as well as your care team,  appreciate your ongoing commitment to your health goals. Please review the following plan we discussed and let me know if I can assist you in the future. Your Game plan/ To Do List    Referrals: If you haven't heard from the office you've been referred to, please reach out to them at the phone provided.   Follow up Visits: We will see or speak with you next year for your Next Medicare AWV with our clinical staff 08/25/24 @ 8:40a Have you seen your provider in the last 6 months (3 months if uncontrolled diabetes)? Appointment 12/02/23 @ 8a  Clinician Recommendations:  Aim for 30 minutes of exercise or brisk walking, 6-8 glasses of water, and 5 servings of fruits and vegetables each day.       This is a list of the screenings recommended for you:  Health Maintenance  Topic Date Due   COVID-19 Vaccine (4 - 2024-25 season) 09/15/2022   Flu Shot  08/15/2023   Medicare Annual Wellness Visit  08/19/2024   DTaP/Tdap/Td vaccine (2 - Td or Tdap) 08/31/2025   Colon Cancer Screening  03/31/2030   Pneumococcal Vaccine for age over 45  Completed   Hepatitis C Screening  Completed   Zoster (Shingles) Vaccine  Completed   Hepatitis B Vaccine  Aged Out   HPV Vaccine  Aged Out   Meningitis B Vaccine  Aged Out    Advanced directives: (Declined) Advance directive discussed with you today. Even though you declined this today, please call our office should you change your mind, and we can give you the proper paperwork for you to fill out. Advance Care Planning is important because it:  [x]  Makes sure you receive the medical care that is consistent with your values, goals, and preferences  [x]  It provides guidance to your family and loved ones and reduces their decisional burden about  whether or not they are making the right decisions based on your wishes.  Follow the link provided in your after visit summary or read over the paperwork we have mailed to you to help you started getting your Advance Directives in place. If you need assistance in completing these, please reach out to us  so that we can help you!  See attachments for Preventive Care and Fall Prevention Tips.

## 2023-08-20 NOTE — Progress Notes (Signed)
 Subjective:   Bryan Hampton is a 68 y.o. who presents for a Medicare Wellness preventive visit.  As a reminder, Annual Wellness Visits don't include a physical exam, and some assessments may be limited, especially if this visit is performed virtually. We may recommend an in-person follow-up visit with your provider if needed.  Visit Complete: Virtual I connected with  Bryan Hampton on 08/20/23 by a audio enabled telemedicine application and verified that I am speaking with the correct person using two identifiers.  Patient Location: Home  Provider Location: Home Office  I discussed the limitations of evaluation and management by telemedicine. The patient expressed understanding and agreed to proceed.  Vital Signs: Because this visit was a virtual/telehealth visit, some criteria may be missing or patient reported. Any vitals not documented were not able to be obtained and vitals that have been documented are patient reported.    Persons Participating in Visit: Patient.  AWV Questionnaire: No: Patient Medicare AWV questionnaire was not completed prior to this visit.  Cardiac Risk Factors include: advanced age (>27men, >60 women);male gender     Objective:    Today's Vitals   08/20/23 0903  Weight: 224 lb (101.6 kg)  Height: 5' 7.5 (1.715 m)   Body mass index is 34.57 kg/m.     08/20/2023    9:08 AM  Advanced Directives  Does Patient Have a Medical Advance Directive? No  Would patient like information on creating a medical advance directive? No - Patient declined    Current Medications (verified) Outpatient Encounter Medications as of 08/20/2023  Medication Sig   ibuprofen (ADVIL,MOTRIN) 200 MG tablet Take 400 mg by mouth as needed (pain).   losartan  (COZAAR ) 100 MG tablet Take 1 tablet (100 mg total) by mouth daily.   No facility-administered encounter medications on file as of 08/20/2023.    Allergies (verified) Penicillins   History: Past  Medical History:  Diagnosis Date   Cataract    Hypertension    Past Surgical History:  Procedure Laterality Date   ANAL FISSURE REPAIR  01/14/1998   COLOSTOMY     HERNIA REPAIR  2006, 2007   Family History  Problem Relation Age of Onset   Alzheimer's disease Maternal Grandmother    Heart disease Maternal Grandfather    Heart disease Paternal Grandfather    Colon polyps Neg Hx    Colon cancer Neg Hx    Esophageal cancer Neg Hx    Rectal cancer Neg Hx    Stomach cancer Neg Hx    Social History   Socioeconomic History   Marital status: Married    Spouse name: Not on file   Number of children: Not on file   Years of education: Not on file   Highest education level: Not on file  Occupational History   Not on file  Tobacco Use   Smoking status: Former    Current packs/day: 0.00    Types: Cigarettes    Quit date: 03/20/1988    Years since quitting: 35.4   Smokeless tobacco: Former    Types: Snuff  Vaping Use   Vaping status: Never Used  Substance and Sexual Activity   Alcohol use: Yes    Alcohol/week: 13.0 standard drinks of alcohol    Types: 12 Cans of beer, 1 Shots of liquor per week    Comment: liqouor once a month   Drug use: No   Sexual activity: Not on file  Other Topics Concern   Not on  file  Social History Narrative   Retired    Teacher, early years/pre Strain: Low Risk  (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)   Overall Financial Resource Strain (CARDIA)    Difficulty of Paying Living Expenses: Not hard at all  Food Insecurity: No Food Insecurity (08/20/2023)   Hunger Vital Sign    Worried About Running Out of Food in the Last Year: Never true    Ran Out of Food in the Last Year: Never true  Transportation Needs: No Transportation Needs (08/20/2023)   PRAPARE - Administrator, Civil Service (Medical): No    Lack of Transportation (Non-Medical): No  Physical Activity: Inactive (08/20/2023)   Exercise Vital Sign    Days of Exercise per Week: 0 days     Minutes of Exercise per Session: 0 min  Stress: No Stress Concern Present (08/20/2023)   [employer] of Occupational Health - Occupational Stress Questionnaire    Feeling of Stress: Not at all  Social Connections: Socially Integrated (08/20/2023)   Social Connection and Isolation Panel    Frequency of Communication with Friends and Family: More than three times a week    Frequency of Social Gatherings with Friends and Family: More than three times a week    Attends Religious Services: More than 4 times per year    Active Member of [employer] or Organizations: Yes    Attends Engineer, structural: More than 4 times per year    Marital Status: Married    Tobacco Counseling Counseling given: Not Answered    Clinical Intake:  Pre-visit preparation completed: Yes  Pain : No/denies pain     BMI - recorded: 34.57 Nutritional Status: BMI > 30  Obese Nutritional Risks: None Diabetes: No  Lab Results  Component Value Date   HGBA1C 5.9 11/29/2022   HGBA1C 5.8 11/27/2021     How often do you need to have someone help you when you read instructions, pamphlets, or other written materials from your doctor or pharmacy?: 1 - Never  Interpreter Needed?: No  Information entered by :: Rojelio Blush LPN   Activities of Daily Living     08/20/2023    9:07 AM  In your present state of health, do you have any difficulty performing the following activities:  Hearing? 0  Vision? 0  Difficulty concentrating or making decisions? 0  Walking or climbing stairs? 0  Dressing or bathing? 0  Doing errands, shopping? 0  Preparing Food and eating ? N  Using the Toilet? N  In the past six months, have you accidently leaked urine? N  Do you have problems with loss of bowel control? N  Managing your Medications? N  Managing your Finances? N  Housekeeping or managing your Housekeeping? N    Patient Care Team: Merna Huxley, NP as PCP - General (Family Medicine)  I have updated your  Care Teams any recent Medical Services you may have received from other providers in the past year.     Assessment:   This is a routine wellness examination for [facility].  Hearing/Vision screen Hearing Screening - Comments:: Denies hearing difficulties   Vision Screening - Comments:: Wears rx glasses - up to date with routine eye exams with  [facility]   Goals Addressed               This Visit's Progress     Increase physical activity (pt-stated)        Get more active.  Depression Screen     08/20/2023    9:06 AM 05/09/2022    4:28 PM 10/11/2021    8:31 AM  PHQ 2/9 Scores  PHQ - 2 Score 0 0 2  PHQ- 9 Score  2 3    Fall Risk     08/20/2023    9:07 AM 05/09/2022    4:30 PM 10/11/2021    8:33 AM  Fall Risk   Falls in the past year? 0 0 0  Number falls in past yr: 0 0 0  Injury with Fall? 0 0 0  Risk for fall due to : No Fall Risks No Fall Risks History of fall(s)  Follow up Falls evaluation completed Falls evaluation completed Falls evaluation completed      Data saved with a previous flowsheet row definition    MEDICARE RISK AT HOME:  Medicare Risk at Home Any stairs in or around the home?: No If so, are there any without handrails?: Yes Home free of loose throw rugs in walkways, pet beds, electrical cords, etc?: Yes Adequate lighting in your home to reduce risk of falls?: Yes Life alert?: No Use of a cane, walker or w/c?: No Grab bars in the bathroom?: No Shower chair or bench in shower?: No Elevated toilet seat or a handicapped toilet?: No  TIMED UP AND GO:  Was the test performed?  No  Cognitive Function: 6CIT completed        08/20/2023    9:08 AM  6CIT Screen  What Year? 0 points  What month? 0 points  What time? 0 points  Count back from 20 0 points  Months in reverse 0 points  Repeat phrase 0 points  Total Score 0 points    Immunizations Immunization History  Administered Date(s) Administered   Fluad Trivalent(High Dose 65+)  11/29/2022   Influenza,inj,quad, With Preservative 09/15/2018   Influenza-Unspecified 12/14/2021   PFIZER(Purple Top)SARS-COV-2 Vaccination 03/31/2019, 04/28/2019, 01/06/2020   PNEUMOCOCCAL CONJUGATE-20 12/14/2021   Tdap 09/01/2015   Zoster Recombinant(Shingrix) 09/24/2017, 03/25/2018    Screening Tests Health Maintenance  Topic Date Due   COVID-19 Vaccine (4 - 2024-25 season) 09/15/2022   INFLUENZA VACCINE  08/15/2023   Medicare Annual Wellness (AWV)  08/19/2024   DTaP/Tdap/Td (2 - Td or Tdap) 08/31/2025   Colonoscopy  03/31/2030   Pneumococcal Vaccine: 50+ Years  Completed   Hepatitis C Screening  Completed   Zoster Vaccines- Shingrix  Completed   Hepatitis B Vaccines  Aged Out   HPV VACCINES  Aged Out   Meningococcal B Vaccine  Aged Out    Health Maintenance  Health Maintenance Due  Topic Date Due   COVID-19 Vaccine (4 - 2024-25 season) 09/15/2022   INFLUENZA VACCINE  08/15/2023   Health Maintenance Items Addressed:   Additional Screening:  Vision Screening: Recommended annual ophthalmology exams for early detection of glaucoma and other disorders of the eye. Would you like a referral to an eye doctor? No    Dental Screening: Recommended annual dental exams for proper oral hygiene  Community Resource Referral / Chronic Care Management: CRR required this visit?  No   CCM required this visit?  No   Plan:    I have personally reviewed and noted the following in the patient's chart:   Medical and social history Use of alcohol, tobacco or illicit drugs  Current medications and supplements including opioid prescriptions. Patient is not currently taking opioid prescriptions. Functional ability and status Nutritional status Physical activity Advanced directives List of other  physicians Hospitalizations, surgeries, and ER visits in previous 12 months Vitals Screenings to include cognitive, depression, and falls Referrals and appointments  In addition, I  have reviewed and discussed with patient certain preventive protocols, quality metrics, and best practice recommendations. A written personalized care plan for preventive services as well as general preventive health recommendations were provided to patient.   Rojelio LELON Blush, LPN   01/17/7972   After Visit Summary: (MyChart) Due to this being a telephonic visit, the after visit summary with patients personalized plan was offered to patient via MyChart   Notes: Nothing significant to report at this time.

## 2023-11-13 DIAGNOSIS — Z6834 Body mass index (BMI) 34.0-34.9, adult: Secondary | ICD-10-CM | POA: Diagnosis not present

## 2023-11-13 DIAGNOSIS — Z880 Allergy status to penicillin: Secondary | ICD-10-CM | POA: Diagnosis not present

## 2023-11-13 DIAGNOSIS — E669 Obesity, unspecified: Secondary | ICD-10-CM | POA: Diagnosis not present

## 2023-11-13 DIAGNOSIS — Z87891 Personal history of nicotine dependence: Secondary | ICD-10-CM | POA: Diagnosis not present

## 2023-11-13 DIAGNOSIS — I10 Essential (primary) hypertension: Secondary | ICD-10-CM | POA: Diagnosis not present

## 2023-12-02 ENCOUNTER — Ambulatory Visit: Attending: Physician Assistant | Admitting: Physician Assistant

## 2023-12-02 ENCOUNTER — Encounter: Payer: Self-pay | Admitting: Adult Health

## 2023-12-02 ENCOUNTER — Encounter: Payer: Self-pay | Admitting: Physician Assistant

## 2023-12-02 ENCOUNTER — Ambulatory Visit: Payer: Self-pay | Admitting: Adult Health

## 2023-12-02 ENCOUNTER — Ambulatory Visit: Payer: Medicare PPO | Admitting: Adult Health

## 2023-12-02 VITALS — BP 130/80 | HR 81 | Ht 67.5 in | Wt 230.8 lb

## 2023-12-02 VITALS — BP 122/68 | HR 67 | Temp 98.3°F | Ht 67.5 in | Wt 228.4 lb

## 2023-12-02 DIAGNOSIS — Z0000 Encounter for general adult medical examination without abnormal findings: Secondary | ICD-10-CM | POA: Diagnosis not present

## 2023-12-02 DIAGNOSIS — R079 Chest pain, unspecified: Secondary | ICD-10-CM

## 2023-12-02 DIAGNOSIS — R072 Precordial pain: Secondary | ICD-10-CM | POA: Diagnosis not present

## 2023-12-02 DIAGNOSIS — Z125 Encounter for screening for malignant neoplasm of prostate: Secondary | ICD-10-CM | POA: Diagnosis not present

## 2023-12-02 DIAGNOSIS — R7303 Prediabetes: Secondary | ICD-10-CM | POA: Diagnosis not present

## 2023-12-02 DIAGNOSIS — I10 Essential (primary) hypertension: Secondary | ICD-10-CM | POA: Diagnosis not present

## 2023-12-02 DIAGNOSIS — E66811 Obesity, class 1: Secondary | ICD-10-CM

## 2023-12-02 LAB — LIPID PANEL
Cholesterol: 157 mg/dL (ref 0–200)
HDL: 43.4 mg/dL (ref >39.00)
LDL Cholesterol: 95 mg/dL (ref 0–99)
NonHDL: 113.39
Total CHOL/HDL Ratio: 4
Triglycerides: 93 mg/dL (ref 0.0–149.0)
VLDL: 18.6 mg/dL (ref 0.0–40.0)

## 2023-12-02 LAB — COMPREHENSIVE METABOLIC PANEL WITH GFR
ALT: 13 U/L (ref 0–53)
AST: 14 U/L (ref 0–37)
Albumin: 4.1 g/dL (ref 3.5–5.2)
Alkaline Phosphatase: 62 U/L (ref 39–117)
BUN: 14 mg/dL (ref 6–23)
CO2: 31 meq/L (ref 19–32)
Calcium: 9.3 mg/dL (ref 8.4–10.5)
Chloride: 104 meq/L (ref 96–112)
Creatinine, Ser: 0.88 mg/dL (ref 0.40–1.50)
GFR: 88.32 mL/min (ref >60.00)
Glucose, Bld: 111 mg/dL — ABNORMAL HIGH (ref 70–99)
Potassium: 4.8 meq/L (ref 3.5–5.1)
Sodium: 140 meq/L (ref 135–145)
Total Bilirubin: 0.6 mg/dL (ref 0.2–1.2)
Total Protein: 6.3 g/dL (ref 6.0–8.3)

## 2023-12-02 LAB — TSH: TSH: 1.84 u[IU]/mL (ref 0.35–5.50)

## 2023-12-02 LAB — CBC
HCT: 46.4 % (ref 39.0–52.0)
Hemoglobin: 15.8 g/dL (ref 13.0–17.0)
MCHC: 34 g/dL (ref 30.0–36.0)
MCV: 90.9 fl (ref 78.0–100.0)
Platelets: 194 K/uL (ref 150.0–400.0)
RBC: 5.11 Mil/uL (ref 4.22–5.81)
RDW: 13.5 % (ref 11.5–15.5)
WBC: 5.3 K/uL (ref 4.0–10.5)

## 2023-12-02 LAB — HEMOGLOBIN A1C: Hgb A1c MFr Bld: 5.9 % (ref 4.6–6.5)

## 2023-12-02 LAB — PSA: PSA: 1.64 ng/mL (ref 0.10–4.00)

## 2023-12-02 MED ORDER — LOSARTAN POTASSIUM 100 MG PO TABS: 100.0000 mg | ORAL_TABLET | Freq: Every day | ORAL | 3 refills | Status: DC

## 2023-12-02 NOTE — Progress Notes (Signed)
 OFFICE NOTE:    Date:  12/02/2023  ID:  Bryan Hampton, DOB 01-Jun-1955, MRN [id number] PCP: Merna Huxley, NP  [facility] Providers Cardiologist:  None        Hypertension Prediabetes Obesity Former smoker  Rheumatic fever as a child       Discussed the use of AI scribe software for clinical note transcription with the patient, who gave verbal consent to proceed. History of Present Illness Bryan Hampton is a 68 y.o. male referred by Merna Huxley, NP for chest pain.     He experiences chest pain on the lower left side at least twice a year, typically in the morning between 8 and 10 AM, often after consuming three to four cups of coffee on an empty stomach. The pain is described as a pressure in the lower chest or upper stomach area that builds up over four to six minutes. He manages the discomfort by sitting still and staying calm. The pain is not associated with exertion, shortness of breath, or other activities, and it does not wake him from sleep.  His family history includes a maternal grandfather who died of a heart attack. There is no other known family history of heart disease or sudden cardiac death.  He is a former smoker and currently consumes approximately twelve beers per week. 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  No shortness of breath, exertional chest pain, leg swelling, fainting, or pain with deep breaths. Reports occasional shortness of breath with significant exertion.  ROS-See HPI     Studies Reviewed:  EKG Interpretation Date/Time:  Tuesday December 02 2023 15:17:56 EST Ventricular Rate:  81 PR Interval:  170 QRS Duration:  82 QT Interval:  356 QTC Calculation: 413 R Axis:   8  Text Interpretation: Normal sinus rhythm Normal ECG Confirmed by Lelon Hamilton [phone]) on 12/02/2023 3:43:15 PM   LABS Potassium: 4.4 mmol/L (11/29/2022) Creatinine: 0.84 mg/dL (88/84/7975) eGFR: 09.78  mL/min/1.37m (11/29/2022) ALT: 14 U/L (11/29/2022) Total Cholesterol: 160 mg/dL (88/84/7975) HDL: 58.0 mg/dL (88/84/7975) LDL: 97 mg/dL (88/84/7975) Triglycerides: 107 mg/dL (88/84/7975) Hemoglobin: 16.1 g/dL (88/84/7975) Platelet Count: 207,000/L (11/29/2022) Hemoglobin A1c: 5.9% (11/29/2022)         Physical Exam:  VS:  BP 130/80   Pulse 81   Ht 5' 7.5 (1.715 m)   Wt 230 lb 12.8 oz (104.7 kg)   SpO2 98%   BMI 35.61 kg/m        Wt Readings from Last 3 Encounters:  12/02/23 230 lb 12.8 oz (104.7 kg)  12/02/23 228 lb 6.4 oz (103.6 kg)  08/20/23 224 lb (101.6 kg)    Constitutional:      Appearance: Healthy appearance. Not in distress.  Neck:     Vascular: No carotid bruit. JVD normal.  Pulmonary:     Breath sounds: Normal breath sounds. No wheezing. No rales.  Cardiovascular:     Normal rate. Regular rhythm.     Murmurs: There is no murmur.  Edema:    Peripheral edema absent.  Abdominal:     Palpations: Abdomen is soft.       Assessment and Plan:    Assessment & Plan Precordial chest pain Intermittent lower left-sided chest pain over the past year, typically occurring in the morning after coffee consumption. Symptoms are not associated with exertion or shortness of breath. Pain lasts 4-6 minutes and is described as pressure in the lower chest. Symptoms are suggestive of  a gastrointestinal origin, possibly related to coffee consumption. His EKG is normal. He has some risk factors for CAD with HTN and prior smoking. It is not unreasonable to pursue CCTA to better define his chest pain. As his symptoms sound mostly noncardiac, I also think it is reasonable to take a trial of PPI, reduce caffeine consumption and eat earlier in the day. Through shared decision making, we decided to pursue conservative management first. If his symptoms continue despite this, he knows to reach out to arrange a CCTA.   - Reduce coffee intake and eat earlier in the morning. - Start  over-the-counter Omeprazole 20 mg once daily for 4-6 weeks, then PRN. - Pt knows to contact me if symptoms continue so we can arrange CCTA - Follow up with Cardiology as needed.         Dispo:  Return for for follow up as needed .  Signed, Glendia Ferrier, PA-C

## 2023-12-02 NOTE — Progress Notes (Signed)
 Subjective:    Patient ID: Bryan Hampton, male    DOB: 1955/05/16, 68 y.o.   MRN: [id number]  HPI Patient presents for yearly preventative medicine examination. He is a pleasant 68 year old male who  has a past medical history of Cataract and Hypertension.  HTN -he is managed with Cozaar  100 mg daily.  He denies dizziness, lightheadedness, chest pain, or shortness of breath.  He does not check his blood pressure at home BP Readings from Last 3 Encounters:  12/02/23 122/68  03/31/23 103/61  11/29/22 120/80   Prediabetes - not currently on medication.  Lab Results  Component Value Date   HGBA1C 5.9 11/29/2022   HGBA1C 5.8 11/27/2021   Acutely, he reports that for an unknown amount, occasionally he will get a pressure like sensation to his left chest. This sensation is not painful but  it is enough to get my attention. Symptoms last a few minutes. He denies palpitations, shortness of breath, pain to his left arm, dizziness or lightheadedness when this happens.   All immunizations and health maintenance protocols were reviewed with the patient and needed orders were placed. He is going to hold off on his flu shot, will get it at a pharmacy.   Appropriate screening laboratory values were ordered for the patient including screening of hyperlipidemia, renal function and hepatic function. If indicated by BPH, a PSA was ordered.  Medication reconciliation,  past medical history, social history, problem list and allergies were reviewed in detail with the patient  Goals were established with regard to weight loss, exercise, and  diet in compliance with medications. He does not follow a specific diet but tries to eat healthy. He does not exercise on  routine basis but stays active outside.  Wt Readings from Last 3 Encounters:  12/02/23 228 lb 6.4 oz (103.6 kg)  08/20/23 224 lb (101.6 kg)  03/31/23 225 lb (102.1 kg)   He is up to date on routine colon cancer screening.     Review of Systems  Constitutional: Negative.   HENT: Negative.    Eyes: Negative.   Respiratory: Negative.    Cardiovascular: Negative.   Gastrointestinal: Negative.   Endocrine: Negative.   Genitourinary: Negative.   Musculoskeletal: Negative.   Skin: Negative.   Allergic/Immunologic: Negative.   Neurological: Negative.   Hematological: Negative.   Psychiatric/Behavioral: Negative.    All other systems reviewed and are negative.  Past Medical History:  Diagnosis Date   Cataract    Hypertension     Social History   Socioeconomic History   Marital status: Married    Spouse name: Not on file   Number of children: Not on file   Years of education: Not on file   Highest education level: Bachelor's degree (e.g., BA, AB, BS)  Occupational History   Not on file  Tobacco Use   Smoking status: Former    Current packs/day: 0.00    Types: Cigarettes    Quit date: 03/20/1988    Years since quitting: 35.7   Smokeless tobacco: Former    Types: Snuff  Vaping Use   Vaping status: Never Used  Substance and Sexual Activity   Alcohol use: Yes    Alcohol/week: 13.0 standard drinks of alcohol    Types: 12 Cans of beer, 1 Shots of liquor per week    Comment: liqouor once a month   Drug use: No   Sexual activity: Not on file  Other Topics Concern   Not on  file  Social History Narrative   Retired    Teacher, Early Years/pre Strain: Low Risk  (11/30/2023)   Overall Financial Resource Strain (CARDIA)    Difficulty of Paying Living Expenses: Not hard at all  Food Insecurity: No Food Insecurity (11/30/2023)   Hunger Vital Sign    Worried About Running Out of Food in the Last Year: Never true    Ran Out of Food in the Last Year: Never true  Transportation Needs: No Transportation Needs (11/30/2023)   PRAPARE - Administrator, Civil Service (Medical): No    Lack of Transportation (Non-Medical): No  Physical Activity: Insufficiently Active  (11/30/2023)   Exercise Vital Sign    Days of Exercise per Week: 2 days    Minutes of Exercise per Session: 30 min  Stress: No Stress Concern Present (11/30/2023)   [employer] of Occupational Health - Occupational Stress Questionnaire    Feeling of Stress: Not at all  Social Connections: Moderately Isolated (11/30/2023)   Social Connection and Isolation Panel    Frequency of Communication with Friends and Family: More than three times a week    Frequency of Social Gatherings with Friends and Family: More than three times a week    Attends Religious Services: Never    Database Administrator or Organizations: No    Attends Engineer, Structural: Not on file    Marital Status: Married  Catering Manager Violence: Not At Risk (08/20/2023)   Humiliation, Afraid, Rape, and Kick questionnaire    Fear of Current or Ex-Partner: No    Emotionally Abused: No    Physically Abused: No    Sexually Abused: No    Past Surgical History:  Procedure Laterality Date   ANAL FISSURE REPAIR  01/14/1998   COLOSTOMY     HERNIA REPAIR  2006, 2007    Family History  Problem Relation Age of Onset   Alzheimer's disease Maternal Grandmother    Heart disease Maternal Grandfather    Heart disease Paternal Grandfather    Colon polyps Neg Hx    Colon cancer Neg Hx    Esophageal cancer Neg Hx    Rectal cancer Neg Hx    Stomach cancer Neg Hx     Allergies  Allergen Reactions   Penicillins Hives and Itching    Current Outpatient Medications on File Prior to Visit  Medication Sig Dispense Refill   azithromycin (ZITHROMAX) 250 MG tablet Take 250 mg by mouth as directed. (Patient taking differently: Take 250 mg by mouth as needed.)     ibuprofen (ADVIL,MOTRIN) 200 MG tablet Take 400 mg by mouth as needed (pain).     losartan  (COZAAR ) 100 MG tablet Take 1 tablet (100 mg total) by mouth daily. 90 tablet 3   predniSONE (STERAPRED UNI-PAK 21 TAB) 5 MG (21) TBPK tablet Take 5 mg by mouth daily.      tadalafil (CIALIS) 20 MG tablet Take 20 mg by mouth daily as needed for erectile dysfunction.     No current facility-administered medications on file prior to visit.    BP 122/68   Pulse 67   Temp 98.3 F (36.8 C)   Ht 5' 7.5 (1.715 m)   Wt 228 lb 6.4 oz (103.6 kg)   SpO2 98%   BMI 35.24 kg/m       Objective:   Physical Exam Vitals and nursing note reviewed.  Constitutional:      General: He is not  in acute distress.    Appearance: Normal appearance. He is not ill-appearing.  HENT:     Head: Normocephalic and atraumatic.     Right Ear: Tympanic membrane, ear canal and external ear normal. There is no impacted cerumen.     Left Ear: Tympanic membrane, ear canal and external ear normal. There is no impacted cerumen.     Nose: Nose normal. No congestion or rhinorrhea.     Mouth/Throat:     Mouth: Mucous membranes are moist.     Pharynx: Oropharynx is clear.  Eyes:     Extraocular Movements: Extraocular movements intact.     Conjunctiva/sclera: Conjunctivae normal.     Pupils: Pupils are equal, round, and reactive to light.  Neck:     Vascular: No carotid bruit.  Cardiovascular:     Rate and Rhythm: Normal rate and regular rhythm.     Pulses: Normal pulses.     Heart sounds: No murmur heard.    No friction rub. No gallop.  Pulmonary:     Effort: Pulmonary effort is normal.     Breath sounds: Normal breath sounds.  Abdominal:     General: Abdomen is flat. Bowel sounds are normal. There is no distension.     Palpations: Abdomen is soft. There is no mass.     Tenderness: There is no abdominal tenderness. There is no guarding or rebound.     Hernia: No hernia is present.  Musculoskeletal:        General: Normal range of motion.     Cervical back: Normal range of motion and neck supple.  Lymphadenopathy:     Cervical: No cervical adenopathy.  Skin:    General: Skin is warm and dry.     Capillary Refill: Capillary refill takes less than 2 seconds.  Neurological:      General: No focal deficit present.     Mental Status: He is alert and oriented to person, place, and time.  Psychiatric:        Mood and Affect: Mood normal.        Behavior: Behavior normal.        Thought Content: Thought content normal.        Judgment: Judgment normal.        Assessment & Plan:  1. Routine general medical examination at a health care facility (Primary) Today patient counseled on age appropriate routine health concerns for screening and prevention, each reviewed and up to date or declined. Immunizations reviewed and up to date or declined. Labs ordered and reviewed. Risk factors for depression reviewed and negative. Hearing function and visual acuity are intact. ADLs screened and addressed as needed. Functional ability and level of safety reviewed and appropriate. Education, counseling and referrals performed based on assessed risks today. Patient provided with a copy of personalized plan for preventive services. - Eat healthy and exercise - Follow up in one year or sooner if needed  2. Primary hypertension - well controlled. No change in medication  - Lipid panel; Future - TSH; Future - CBC; Future - Comprehensive metabolic panel with GFR; Future - losartan  (COZAAR ) 100 MG tablet; Take 1 tablet (100 mg total) by mouth daily.  Dispense: 90 tablet; Refill: 3 - Comprehensive metabolic panel with GFR - CBC - TSH - Lipid panel  3. Prediabetes - Consider metformin  - Lipid panel; Future - TSH; Future - CBC; Future - Comprehensive metabolic panel with GFR; Future - Hemoglobin A1c; Future - Hemoglobin A1c - Comprehensive metabolic  panel with GFR - CBC - TSH - Lipid panel  4. Class 1 obesity - Encouraged weight loss through diet and exercise  - Lipid panel; Future - TSH; Future - CBC; Future - Comprehensive metabolic panel with GFR; Future - Comprehensive metabolic panel with GFR - CBC - TSH - Lipid panel  5. Prostate cancer screening  - PSA;  Future - PSA  6. Left-sided chest pain - Will refer to Cardiology for further assessment. EKG machines not working in this office today  - Ambulatory referral to Cardiology  Darleene Shape, NP

## 2023-12-02 NOTE — Patient Instructions (Signed)
 Medication Instructions:  No changes *If you need a refill on your cardiac medications before your next appointment, please call your pharmacy*  Follow-Up: At [facility], you and your health needs are our priority.  As part of our continuing mission to provide you with exceptional heart care, our providers are all part of one team.  This team includes your primary Cardiologist (physician) and Advanced Practice Providers or APPs (Physician Assistants and Nurse Practitioners) who all work together to provide you with the care you need, when you need it.  Your next appointment:   As needed  Provider:   Glendia Ferrier, PA-C          We recommend signing up for the patient portal called MyChart.  Sign up information is provided on this After Visit Summary.  MyChart is used to connect with patients for Virtual Visits (Telemedicine).  Patients are able to view lab/test results, encounter notes, upcoming appointments, etc.  Non-urgent messages can be sent to your provider as well.   To learn more about what you can do with MyChart, go to [email or web address].   Other Instructions  Try getting Omeprazole 20 mg once daily (over the counter) for 4-6 weeks. Reduce coffee and eat earlier If your symptoms continue despite this, let me know and we can arrange the CT scan

## 2024-02-03 ENCOUNTER — Other Ambulatory Visit: Payer: Self-pay | Admitting: Adult Health

## 2024-02-03 DIAGNOSIS — I10 Essential (primary) hypertension: Secondary | ICD-10-CM

## 2024-08-25 ENCOUNTER — Ambulatory Visit

## 2024-12-02 ENCOUNTER — Encounter: Admitting: Adult Health
